# Patient Record
Sex: Male | Born: 1955 | Race: White | Hispanic: No | Marital: Married | Smoking: Current every day smoker
Health system: Southern US, Community
[De-identification: ages and names within clinical notes are randomized; demographics above are authoritative.]

## PROBLEM LIST (undated history)

## (undated) DIAGNOSIS — E785 Hyperlipidemia, unspecified: Secondary | ICD-10-CM

## (undated) DIAGNOSIS — I8392 Asymptomatic varicose veins of left lower extremity: Secondary | ICD-10-CM

## (undated) DIAGNOSIS — E7800 Pure hypercholesterolemia, unspecified: Secondary | ICD-10-CM

## (undated) DIAGNOSIS — Z86711 Personal history of pulmonary embolism: Secondary | ICD-10-CM

## (undated) DIAGNOSIS — I10 Essential (primary) hypertension: Secondary | ICD-10-CM

## (undated) HISTORY — PX: COLONOSCOPY: SHX174

## (undated) HISTORY — PX: VASECTOMY: SHX75

## (undated) HISTORY — PX: TUBAL LIGATION: SHX77

## (undated) HISTORY — DX: Asymptomatic varicose veins of left lower extremity: I83.92

## (undated) HISTORY — DX: Hyperlipidemia, unspecified: E78.5

---

## 2004-05-21 ENCOUNTER — Ambulatory Visit: Payer: Self-pay | Admitting: Gastroenterology

## 2004-06-17 ENCOUNTER — Ambulatory Visit: Payer: Self-pay | Admitting: Gastroenterology

## 2006-10-28 ENCOUNTER — Ambulatory Visit: Payer: Self-pay | Admitting: Gastroenterology

## 2011-08-17 HISTORY — PX: VASCULAR SURGERY: SHX849

## 2012-02-24 ENCOUNTER — Ambulatory Visit: Payer: Self-pay | Admitting: Gastroenterology

## 2012-02-24 LAB — HM COLONOSCOPY

## 2012-02-25 LAB — PATHOLOGY REPORT

## 2013-09-25 LAB — CBC AND DIFFERENTIAL
HCT: 47 % (ref 41–53)
Hemoglobin: 17 g/dL (ref 13.5–17.5)
PLATELETS: 166 10*3/uL (ref 150–399)
WBC: 4.4 10^3/mL

## 2013-09-25 LAB — TSH: TSH: 1.4 u[IU]/mL (ref <=5.90)

## 2013-11-26 LAB — LIPID PANEL
Cholesterol: 252 mg/dL — AB (ref 0–200)
HDL: 56 mg/dL (ref 35–70)
LDL Cholesterol: 165 mg/dL
TRIGLYCERIDES: 154 mg/dL (ref 40–160)

## 2013-11-26 LAB — BASIC METABOLIC PANEL
BUN: 14 mg/dL (ref 4–21)
CREATININE: 1.3 mg/dL (ref <=1.3)
Glucose: 106 mg/dL
POTASSIUM: 4.4 mmol/L (ref 3.4–5.3)
SODIUM: 140 mmol/L (ref 137–147)

## 2013-11-26 LAB — HEPATIC FUNCTION PANEL
ALT: 44 U/L — AB (ref 10–40)
AST: 31 U/L (ref 14–40)

## 2014-12-18 LAB — POCT INR: INR: 2 — AB (ref <=1.1)

## 2014-12-18 LAB — PROTIME-INR: PROTIME: 24.5 s — AB (ref 10.0–13.8)

## 2015-01-10 DIAGNOSIS — N419 Inflammatory disease of prostate, unspecified: Secondary | ICD-10-CM | POA: Insufficient documentation

## 2015-01-10 DIAGNOSIS — Z8614 Personal history of Methicillin resistant Staphylococcus aureus infection: Secondary | ICD-10-CM | POA: Insufficient documentation

## 2015-01-10 DIAGNOSIS — F17200 Nicotine dependence, unspecified, uncomplicated: Secondary | ICD-10-CM | POA: Insufficient documentation

## 2015-01-10 DIAGNOSIS — E785 Hyperlipidemia, unspecified: Secondary | ICD-10-CM | POA: Insufficient documentation

## 2015-01-10 DIAGNOSIS — K635 Polyp of colon: Secondary | ICD-10-CM | POA: Insufficient documentation

## 2015-01-10 DIAGNOSIS — D709 Neutropenia, unspecified: Secondary | ICD-10-CM | POA: Insufficient documentation

## 2015-01-10 DIAGNOSIS — G4700 Insomnia, unspecified: Secondary | ICD-10-CM | POA: Insufficient documentation

## 2015-01-10 DIAGNOSIS — I8392 Asymptomatic varicose veins of left lower extremity: Secondary | ICD-10-CM | POA: Insufficient documentation

## 2015-01-10 DIAGNOSIS — I82409 Acute embolism and thrombosis of unspecified deep veins of unspecified lower extremity: Secondary | ICD-10-CM | POA: Insufficient documentation

## 2015-01-10 DIAGNOSIS — E7800 Pure hypercholesterolemia, unspecified: Secondary | ICD-10-CM | POA: Insufficient documentation

## 2015-01-10 DIAGNOSIS — I10 Essential (primary) hypertension: Secondary | ICD-10-CM | POA: Insufficient documentation

## 2015-01-10 DIAGNOSIS — Z86711 Personal history of pulmonary embolism: Secondary | ICD-10-CM | POA: Insufficient documentation

## 2015-01-10 DIAGNOSIS — L0293 Carbuncle, unspecified: Secondary | ICD-10-CM | POA: Insufficient documentation

## 2015-01-10 DIAGNOSIS — I749 Embolism and thrombosis of unspecified artery: Secondary | ICD-10-CM | POA: Insufficient documentation

## 2015-01-10 DIAGNOSIS — F419 Anxiety disorder, unspecified: Secondary | ICD-10-CM | POA: Insufficient documentation

## 2015-01-10 DIAGNOSIS — I8390 Asymptomatic varicose veins of unspecified lower extremity: Secondary | ICD-10-CM | POA: Insufficient documentation

## 2015-01-10 HISTORY — DX: Personal history of Methicillin resistant Staphylococcus aureus infection: Z86.14

## 2015-01-15 ENCOUNTER — Other Ambulatory Visit: Payer: Self-pay | Admitting: Family Medicine

## 2015-01-16 ENCOUNTER — Telehealth: Payer: Self-pay

## 2015-01-16 LAB — PROTIME-INR
INR: 1.8 — ABNORMAL HIGH (ref 0.8–1.2)
INR: 1.8 — AB (ref 0.9–1.1)
PT: 18.7
Prothrombin Time: 18.7 s — ABNORMAL HIGH (ref 9.1–12.0)

## 2015-01-16 NOTE — Telephone Encounter (Signed)
Slightly low. Increase to 7.5 mg every day and recheck in 2 weeks.

## 2015-01-16 NOTE — Telephone Encounter (Signed)
PT/INR results  PT 18.7     INR 1.8

## 2015-01-16 NOTE — Telephone Encounter (Signed)
Tried to call pt. Left message to call back. 

## 2015-01-17 NOTE — Telephone Encounter (Signed)
Talked to pt regarding PT/INR results. PT 18.7 and INR 1.8.  Pt verbalized fully understanding for taking 7.5 mg daily and to come back for a PT/INR check  In 2 weeks.  Talked to front staff to make the appointment for Wednesday June 15th in the morning.

## 2015-01-29 ENCOUNTER — Ambulatory Visit (INDEPENDENT_AMBULATORY_CARE_PROVIDER_SITE_OTHER): Payer: BLUE CROSS/BLUE SHIELD

## 2015-01-29 DIAGNOSIS — I82409 Acute embolism and thrombosis of unspecified deep veins of unspecified lower extremity: Secondary | ICD-10-CM | POA: Diagnosis not present

## 2015-01-29 LAB — POCT INR
INR: 3.7
PT: 44.2

## 2015-01-29 NOTE — Patient Instructions (Signed)
PT/INR  INR 3.7 today.  Hold 1 pill today of 7.5 mg. 1 pill daily except 1/2 pill on Monday.  Come back in 2 weeks for PT/INR  Check Call if any questions or concerns.

## 2015-02-12 ENCOUNTER — Ambulatory Visit (INDEPENDENT_AMBULATORY_CARE_PROVIDER_SITE_OTHER): Payer: BLUE CROSS/BLUE SHIELD

## 2015-02-12 DIAGNOSIS — I82409 Acute embolism and thrombosis of unspecified deep veins of unspecified lower extremity: Secondary | ICD-10-CM | POA: Diagnosis not present

## 2015-02-12 LAB — POCT INR
INR: 2.1
PT: 25.7

## 2015-02-12 NOTE — Patient Instructions (Signed)
Continue same dose. Come back in 4 weeks. Call if any questions or concerns.

## 2015-03-12 ENCOUNTER — Ambulatory Visit (INDEPENDENT_AMBULATORY_CARE_PROVIDER_SITE_OTHER): Payer: BLUE CROSS/BLUE SHIELD

## 2015-03-12 DIAGNOSIS — I82409 Acute embolism and thrombosis of unspecified deep veins of unspecified lower extremity: Secondary | ICD-10-CM | POA: Diagnosis not present

## 2015-03-12 LAB — POCT INR
INR: 2.6
PT: 31

## 2015-03-12 NOTE — Patient Instructions (Signed)
PT/INR today INR 2.6  Continue same dose, Coumadin 7.5 mg Daily except 1/2 pill on Mondays. Come back in 4 weeks for an PT/INR. Call if any questions or concerns.

## 2015-04-14 ENCOUNTER — Other Ambulatory Visit: Payer: Self-pay | Admitting: Family Medicine

## 2015-04-14 DIAGNOSIS — I82409 Acute embolism and thrombosis of unspecified deep veins of unspecified lower extremity: Secondary | ICD-10-CM

## 2015-04-14 MED ORDER — WARFARIN SODIUM 7.5 MG PO TABS: 7.5000 mg | ORAL_TABLET | Freq: Every day | ORAL | Status: DC

## 2015-04-14 NOTE — Addendum Note (Signed)
Addended by: Kavin Leech E on: 04/14/2015 02:35 PM   Modules accepted: Orders

## 2015-04-14 NOTE — Telephone Encounter (Signed)
Refill needed warfarin (COUMADIN) 7.5 MG tablet  [facility] in [place].  Thanks [employer]

## 2015-04-15 ENCOUNTER — Other Ambulatory Visit: Payer: Self-pay | Admitting: Family Medicine

## 2015-04-15 DIAGNOSIS — I82409 Acute embolism and thrombosis of unspecified deep veins of unspecified lower extremity: Secondary | ICD-10-CM

## 2015-04-16 ENCOUNTER — Telehealth: Payer: Self-pay | Admitting: Family Medicine

## 2015-04-18 ENCOUNTER — Ambulatory Visit (INDEPENDENT_AMBULATORY_CARE_PROVIDER_SITE_OTHER): Payer: BLUE CROSS/BLUE SHIELD

## 2015-04-18 DIAGNOSIS — I82409 Acute embolism and thrombosis of unspecified deep veins of unspecified lower extremity: Secondary | ICD-10-CM | POA: Diagnosis not present

## 2015-04-18 LAB — POCT INR: INR: 2.7

## 2015-04-18 NOTE — Patient Instructions (Signed)
7.5 mg (1pill) Daily except  1/2 pill on Mondays. Come back in 4 weeks. Call if any questions or concerns.  

## 2015-05-16 ENCOUNTER — Ambulatory Visit (INDEPENDENT_AMBULATORY_CARE_PROVIDER_SITE_OTHER): Payer: BLUE CROSS/BLUE SHIELD

## 2015-05-16 ENCOUNTER — Telehealth: Payer: Self-pay | Admitting: Family Medicine

## 2015-05-16 DIAGNOSIS — I82409 Acute embolism and thrombosis of unspecified deep veins of unspecified lower extremity: Secondary | ICD-10-CM

## 2015-05-16 LAB — POCT INR
INR: 3
PT: 35.9

## 2015-05-16 NOTE — Telephone Encounter (Signed)
Patient need CPE scheduled. Thanks.

## 2015-05-16 NOTE — Telephone Encounter (Signed)
-----   Message from Girtha Hake, RN sent at 05/16/2015  8:26 AM EDT ----- 7.5 mg (1pill) Daily except  1/2 pill on Mondays. Come back in 4 weeks. Call if any questions or concerns.

## 2015-05-16 NOTE — Patient Instructions (Signed)
7.5 mg (1pill) Daily except  1/2 pill on Mondays. Come back in 4 weeks. Call if any questions or concerns.  

## 2015-05-19 NOTE — Telephone Encounter (Signed)
Appointment scheduled for 08/12/2015@3 :00/MW

## 2015-06-13 ENCOUNTER — Ambulatory Visit (INDEPENDENT_AMBULATORY_CARE_PROVIDER_SITE_OTHER): Payer: BLUE CROSS/BLUE SHIELD

## 2015-06-13 DIAGNOSIS — I82409 Acute embolism and thrombosis of unspecified deep veins of unspecified lower extremity: Secondary | ICD-10-CM

## 2015-06-13 LAB — POCT INR
INR: 2.6
PT: 31.5

## 2015-06-13 NOTE — Patient Instructions (Signed)
Anticoagulation Dose Instructions as of 06/13/2015      Tyler SmilesSun Mon Tue Wed Thu Fri Sat   New Dose 7.5 mg 3.75 mg 7.5 mg 7.5 mg 7.5 mg 7.5 mg 7.5 mg    Description        7.5 mg (1pill) Daily except  1/2 pill on Mondays. Come back in 4 weeks. Call if any questions or concerns.

## 2015-07-16 ENCOUNTER — Ambulatory Visit (INDEPENDENT_AMBULATORY_CARE_PROVIDER_SITE_OTHER): Payer: BLUE CROSS/BLUE SHIELD

## 2015-07-16 DIAGNOSIS — I82409 Acute embolism and thrombosis of unspecified deep veins of unspecified lower extremity: Secondary | ICD-10-CM | POA: Diagnosis not present

## 2015-07-16 LAB — POCT INR
INR: 2.1
PT: 25

## 2015-07-16 NOTE — Progress Notes (Signed)
Patient ID: Tyler Estes, male   DOB: 08-Mar-1956, 59 y.o.   MRN: [id number] Anticoagulation Dose Instructions as of 07/16/2015      Glynis SmilesSun Mon Tue Wed Thu Fri Sat   New Dose 7.5 mg 3.75 mg 7.5 mg 7.5 mg 7.5 mg 7.5 mg 7.5 mg    Description        7.5 mg (1pill) Daily except  1/2 pill on Mondays. Come back in 4 weeks. Call if any questions or concerns.

## 2015-07-16 NOTE — Patient Instructions (Signed)
7.5 mg (1pill) Daily except  1/2 pill on Mondays. Come back in 4 weeks. Call if any questions or concerns.

## 2015-08-12 ENCOUNTER — Encounter: Payer: BLUE CROSS/BLUE SHIELD | Admitting: Family Medicine

## 2015-08-13 ENCOUNTER — Ambulatory Visit (INDEPENDENT_AMBULATORY_CARE_PROVIDER_SITE_OTHER): Payer: BLUE CROSS/BLUE SHIELD

## 2015-08-13 DIAGNOSIS — I82409 Acute embolism and thrombosis of unspecified deep veins of unspecified lower extremity: Secondary | ICD-10-CM

## 2015-08-13 LAB — POCT INR
INR: 1.1
PT: 13.7

## 2015-08-13 NOTE — Patient Instructions (Signed)
Anticoagulation Dose Instructions as of 08/13/2015      Glynis SmilesSun Mon Tue Wed Thu Fri Sat   New Dose 7.5 mg 7.5 mg 7.5 mg 7.5 mg 7.5 mg 7.5 mg 7.5 mg    Description        7.5 mg daily Come back in 1 week Call if any questions or concerns.

## 2015-08-20 ENCOUNTER — Ambulatory Visit (INDEPENDENT_AMBULATORY_CARE_PROVIDER_SITE_OTHER): Payer: BLUE CROSS/BLUE SHIELD

## 2015-08-20 DIAGNOSIS — I82409 Acute embolism and thrombosis of unspecified deep veins of unspecified lower extremity: Secondary | ICD-10-CM | POA: Diagnosis not present

## 2015-08-20 LAB — POCT INR
INR: 2
PT: 24.2

## 2015-08-20 NOTE — Patient Instructions (Signed)
Anticoagulation Dose Instructions as of 08/20/2015      Glynis SmilesSun Mon Tue Wed Thu Fri Sat   New Dose 7.5 mg 7.5 mg 7.5 mg 7.5 mg 7.5 mg 7.5 mg 7.5 mg    Description        7.5 mg daily Come back in 2 week Call if any questions or concerns.

## 2015-09-03 ENCOUNTER — Ambulatory Visit (INDEPENDENT_AMBULATORY_CARE_PROVIDER_SITE_OTHER): Payer: BLUE CROSS/BLUE SHIELD

## 2015-09-03 DIAGNOSIS — I82409 Acute embolism and thrombosis of unspecified deep veins of unspecified lower extremity: Secondary | ICD-10-CM

## 2015-09-03 LAB — POCT INR
INR: 2.7
PT: 32.1

## 2015-09-03 NOTE — Patient Instructions (Signed)
Anticoagulation Dose Instructions as of 09/03/2015      Tyler Estes Tue Wed Thu Fri Sat   New Dose 7.5 mg 7.5 mg 7.5 mg 7.5 mg 7.5 mg 7.5 mg 7.5 mg    Description        7.5 mg daily Follow up in 4 week Call if any questions or concerns.

## 2015-09-30 ENCOUNTER — Encounter: Payer: Self-pay | Admitting: Family Medicine

## 2015-09-30 ENCOUNTER — Ambulatory Visit (INDEPENDENT_AMBULATORY_CARE_PROVIDER_SITE_OTHER): Payer: BLUE CROSS/BLUE SHIELD | Admitting: Family Medicine

## 2015-09-30 VITALS — BP 172/100 | HR 76 | Temp 97.9°F | Resp 16 | Ht 72.0 in | Wt 204.0 lb

## 2015-09-30 DIAGNOSIS — F17200 Nicotine dependence, unspecified, uncomplicated: Secondary | ICD-10-CM

## 2015-09-30 DIAGNOSIS — I82409 Acute embolism and thrombosis of unspecified deep veins of unspecified lower extremity: Secondary | ICD-10-CM

## 2015-09-30 DIAGNOSIS — E7800 Pure hypercholesterolemia, unspecified: Secondary | ICD-10-CM | POA: Diagnosis not present

## 2015-09-30 DIAGNOSIS — I10 Essential (primary) hypertension: Secondary | ICD-10-CM

## 2015-09-30 DIAGNOSIS — Z0000 Encounter for general adult medical examination without abnormal findings: Secondary | ICD-10-CM | POA: Diagnosis not present

## 2015-09-30 LAB — POCT INR
INR: 2.5
PT: 29.6

## 2015-09-30 MED ORDER — PRAVASTATIN SODIUM 40 MG PO TABS: 40.0000 mg | ORAL_TABLET | Freq: Every day | ORAL | Status: DC

## 2015-09-30 MED ORDER — LISINOPRIL-HYDROCHLOROTHIAZIDE 10-12.5 MG PO TABS: 1.0000 | ORAL_TABLET | Freq: Every day | ORAL | Status: DC

## 2015-09-30 NOTE — Patient Instructions (Signed)
Continue current dose. Recheck 4 weeks.

## 2015-09-30 NOTE — Progress Notes (Signed)
Patient: Tyler Estes, Male    DOB: 1956-03-11, 60 y.o.   MRN: [id number] Visit Date: 09/30/2015  Today's Provider: Lorie Phenix, MD   Chief Complaint  Patient presents with  . Annual Exam  . Anticoagulation   Subjective:    Annual physical exam Tyler Estes is a 60 y.o. male who presents today for health maintenance and complete physical. He feels well. He reports exercising daily; active while working. He reports he is sleeping fairly well.  Last CPE- 10/22/2011 Last EKG- 2013 Last colon- 02/24/2012- Dr. Bluford Kaufmann. 1 small polyp (tubular adenoma). Repeat 5 years.  ----------------------------------------------------------------- Anticoagulation: Patient here for anticoagulation monitoring. Indication: DVT and PE Bleeding Signs/Symptoms:  None Thromboembolic Signs/Symptoms:  None  Missed Coumadin Doses:  None Medication Changes:  no Dietary Changes:  no Bacterial/Viral Infection:  no  Other Concerns:  no  Hypertension: Patient here for follow-up of elevated blood pressure. He is exercising and is adherent to low salt diet.  Blood pressure is not being checked well controlled at home. Cardiac symptoms none. Patient denies chest pain, dyspnea, lower extremity edema and palpitations.  Cardiovascular risk factors: dyslipidemia, hypertension, male gender and smoking/ tobacco exposure. Use of agents associated with hypertension: none. History of target organ damage: none.  Review of Systems  Constitutional: Negative.   HENT: Negative.   Eyes: Negative.   Respiratory: Negative.   Cardiovascular: Negative.   Gastrointestinal: Negative.   Endocrine: Negative.   Genitourinary: Negative.   Musculoskeletal: Negative.   Skin: Negative.   Allergic/Immunologic: Negative.   Neurological: Negative.   Hematological: Negative.   Psychiatric/Behavioral: Negative.     Social History      He  reports that he has been smoking Cigarettes.  He has been smoking about 0.50 packs per  day. He has never used smokeless tobacco. He reports that he drinks about 8.4 oz of alcohol per week. He reports that he does not use illicit drugs.       Social History   Social History  . Marital Status: Married    Spouse Name: Hendrick Pavich  . Number of Children: 2  . Years of Education: HS   Occupational History  . 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 Smoking status: Current Every Day Smoker -- 0.50 packs/day    Types: Cigarettes  . Smokeless tobacco: Never Used  . Alcohol Use: 8.4 oz/week    14 Cans of beer per week     Comment: 2 beers a day  . Drug Use: No     Comment: Quit in 1997  . Sexual Activity: Not Asked   Other Topics Concern  . None   Social History Narrative    No past medical history on file.   Patient Active Problem List   Diagnosis Date Noted  . Anxiety 01/10/2015  . Carbuncle of skin and subcutaneous tissue 01/10/2015  . Benign colon polyp 01/10/2015  . Deep vein thrombosis (HCC) 01/10/2015  . Embolism (HCC) 01/10/2015  . Hypercholesteremia 01/10/2015  . BP (high blood pressure) 01/10/2015  . Cannot sleep 01/10/2015  . Infection with methicillin-resistant Staphylococcus aureus 01/10/2015  . Neutropenia (HCC) 01/10/2015  . PE (pulmonary embolism) 01/10/2015  . Prostatitis 01/10/2015  . 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 Compulsive tobacco user syndrome 01/10/2015    Past Surgical History  Procedure Laterality Date  . Vascular surgery  2013    Leg  . Vasectomy      Family  History        Family Status  Relation Status Death Age  . Mother Alive     Back Operations  . Father Deceased 48    Myocardial Infarction; Mesothelioma  . Sister Alive     Alzheimers        His family history includes Dementia in his sister; Heart disease in his father; Hyperlipidemia in his mother.    Allergies  Allergen Reactions  . Aspirin     Previous Medications   CHLORTHALIDONE (HYGROTON) 25 MG TABLET    Take by mouth. Reported on  09/30/2015   PRAVASTATIN (PRAVACHOL) 40 MG TABLET    Take by mouth. Reported on 09/30/2015   WARFARIN (COUMADIN) 7.5 MG TABLET    TAKE ONE TABLET BY MOUTH ONCE DAILY AS DIRECTED BY PHYSICIAN    Patient Care Team: Lorie Phenix, MD as PCP - General (Family Medicine)     Objective:   Vitals: BP 172/100 mmHg  Pulse 76  Temp(Src) 97.9 F (36.6 C) (Oral)  Resp 16  Ht 6' (1.829 m)  Wt 204 lb (92.534 kg)  BMI 27.66 kg/m2   Physical Exam  Constitutional: He is oriented to person, place, and time. He appears well-developed and well-nourished.  HENT:  Head: Normocephalic and atraumatic.  Right Ear: External ear normal.  Left Ear: External ear normal.  Nose: Nose normal.  Mouth/Throat: Oropharynx is clear and moist.  Eyes: Conjunctivae are normal. Right eye exhibits no discharge. Left eye exhibits no discharge.  Neck: Normal range of motion. Neck supple. No thyromegaly present.  Cardiovascular: Normal rate, regular rhythm and normal heart sounds.   Pulmonary/Chest: Effort normal and breath sounds normal. No respiratory distress. He has no wheezes.  Abdominal: Soft. Bowel sounds are normal.  Musculoskeletal: Normal range of motion. He exhibits no edema.  Neurological: He is alert and oriented to person, place, and time.  Skin: Skin is warm and dry.  Psychiatric: He has a normal mood and affect. His behavior is normal.     Depression Screen PHQ 2/9 Scores 09/30/2015  PHQ - 2 Score 0      Assessment & Plan:     Routine Health Maintenance and Physical Exam  Exercise Activities and Dietary recommendations Goals    None       There is no immunization history on file for this patient.  Health Maintenance  Topic Date Due  . Hepatitis C Screening  10-Apr-1956  . HIV Screening  07/06/1970  . TETANUS/TDAP  07/06/1974  . ZOSTAVAX  07/07/2015  . INFLUENZA VACCINE  11/14/2015 (Originally 03/17/2015)  . COLONOSCOPY  02/23/2022      Discussed health benefits of physical  activity, and encouraged him to engage in regular exercise appropriate for his age and condition.   1. Annual physical exam Stressed importance of taking better care of himself. Quitting smoking and decreasing drinking. Increasing exercise.  Continue current medication.    2. DVT (deep venous thrombosis), unspecified laterality Stable. Continue current medication and recheck at follow up in 4 weeks.   - POCT INR  3. Essential hypertension Not controlled.  Will start Lisinopril- HCTZ and recheck in 4 weeks.  Check labs today and again in 4 weeks.  - CBC with Differential/Platelet - Comprehensive metabolic panel - lisinopril-hydrochlorothiazide (PRINZIDE,ZESTORETIC) 10-12.5 MG tablet; Take 1 tablet by mouth daily.  Dispense: 30 tablet; Refill: 3  4. Hypercholesteremia Restart medication. Check labs in 4 weeks.  - pravastatin (PRAVACHOL) 40 MG tablet; Take 1 tablet (40 mg total)  by mouth daily. Reported on 09/30/2015  Dispense: 30 tablet; Refill: 6  5. Compulsive tobacco user syndrome Stressed importance of quitting smoking.   Tried to explain very clearly and forcefully that his current lifestyle and not taking his medication is as significant risk to his health as stopping his coumadin, if not more so.    Patient was seen and examined by Leo Grosser, MD, and note scribed by Allene Dillon, CMA. I have reviewed the document for accuracy and completeness and I agree with above. Leo Grosser, MD   Lorie Phenix, MD    --------------------------------------------------------------------

## 2015-10-01 ENCOUNTER — Telehealth: Payer: Self-pay

## 2015-10-01 ENCOUNTER — Ambulatory Visit: Payer: BLUE CROSS/BLUE SHIELD

## 2015-10-01 LAB — COMPREHENSIVE METABOLIC PANEL
A/G RATIO: 2.1 (ref 1.1–2.5)
ALK PHOS: 73 IU/L (ref 39–117)
ALT: 38 IU/L (ref 0–44)
AST: 24 IU/L (ref 0–40)
Albumin: 4.4 g/dL (ref 3.6–4.8)
BILIRUBIN TOTAL: 0.3 mg/dL (ref 0.0–1.2)
BUN / CREAT RATIO: 16 (ref 10–22)
BUN: 15 mg/dL (ref 8–27)
CHLORIDE: 105 mmol/L (ref 96–106)
CO2: 21 mmol/L (ref 18–29)
Calcium: 9.3 mg/dL (ref 8.6–10.2)
Creatinine, Ser: 0.93 mg/dL (ref 0.76–1.27)
GFR calc non Af Amer: 89 mL/min/{1.73_m2} (ref >59)
GFR, EST AFRICAN AMERICAN: 103 mL/min/{1.73_m2} (ref >59)
Globulin, Total: 2.1 g/dL (ref 1.5–4.5)
Glucose: 110 mg/dL — ABNORMAL HIGH (ref 65–99)
POTASSIUM: 4.3 mmol/L (ref 3.5–5.2)
Sodium: 144 mmol/L (ref 134–144)
Total Protein: 6.5 g/dL (ref 6.0–8.5)

## 2015-10-01 LAB — CBC WITH DIFFERENTIAL/PLATELET
BASOS: 0 %
Basophils Absolute: 0 10*3/uL (ref 0.0–0.2)
EOS (ABSOLUTE): 0.1 10*3/uL (ref 0.0–0.4)
Eos: 1 %
HEMOGLOBIN: 16.5 g/dL (ref 12.6–17.7)
Hematocrit: 47 % (ref 37.5–51.0)
IMMATURE GRANS (ABS): 0 10*3/uL (ref 0.0–0.1)
Immature Granulocytes: 0 %
LYMPHS: 39 %
Lymphocytes Absolute: 1.7 10*3/uL (ref 0.7–3.1)
MCH: 33.8 pg — AB (ref 26.6–33.0)
MCHC: 35.1 g/dL (ref 31.5–35.7)
MCV: 96 fL (ref 79–97)
Monocytes Absolute: 0.4 10*3/uL (ref 0.1–0.9)
Monocytes: 10 %
NEUTROS ABS: 2.1 10*3/uL (ref 1.4–7.0)
Neutrophils: 50 %
PLATELETS: 202 10*3/uL (ref 150–379)
RBC: 4.88 x10E6/uL (ref 4.14–5.80)
RDW: 13.5 % (ref 12.3–15.4)
WBC: 4.2 10*3/uL (ref 3.4–10.8)

## 2015-10-01 NOTE — Telephone Encounter (Signed)
Patient advised as below. sd 

## 2015-10-01 NOTE — Telephone Encounter (Signed)
-----   Message from Lorie Phenix, MD sent at 10/01/2015  8:50 AM EST ----- Labs stable.  Please notify patient. Thanks.

## 2015-10-28 ENCOUNTER — Ambulatory Visit (INDEPENDENT_AMBULATORY_CARE_PROVIDER_SITE_OTHER): Payer: BLUE CROSS/BLUE SHIELD | Admitting: Family Medicine

## 2015-10-28 ENCOUNTER — Encounter: Payer: Self-pay | Admitting: Family Medicine

## 2015-10-28 VITALS — BP 168/98 | HR 84 | Temp 98.8°F | Resp 16 | Wt 205.0 lb

## 2015-10-28 DIAGNOSIS — I2699 Other pulmonary embolism without acute cor pulmonale: Secondary | ICD-10-CM

## 2015-10-28 DIAGNOSIS — I82409 Acute embolism and thrombosis of unspecified deep veins of unspecified lower extremity: Secondary | ICD-10-CM

## 2015-10-28 DIAGNOSIS — E7800 Pure hypercholesterolemia, unspecified: Secondary | ICD-10-CM

## 2015-10-28 DIAGNOSIS — I10 Essential (primary) hypertension: Secondary | ICD-10-CM | POA: Diagnosis not present

## 2015-10-28 LAB — POCT INR
INR: 2.8
PT: 33.4

## 2015-10-28 MED ORDER — LISINOPRIL-HYDROCHLOROTHIAZIDE 20-25 MG PO TABS: 1.0000 | ORAL_TABLET | Freq: Every day | ORAL | Status: DC

## 2015-10-28 NOTE — Patient Instructions (Signed)
Continue current medications; recheck in four weeks.

## 2015-10-28 NOTE — Progress Notes (Signed)
Patient ID: Tyler Estes, male   DOB: [birth year], 60 y.o.   MRN: [id number]         Patient: Tyler Estes Male    DOB: [birth year]   60 y.o.   MRN: [id number] Visit Date: 10/28/2015  Today's Provider: Lorie Phenix, MD   Chief Complaint  Patient presents with  . Hypertension  . Hyperlipidemia  . DVT   Subjective:    Hypertension This is a chronic (Pt's Blood pressure was elevated last visit 09/2015 and added Lisinopril/HCTZ 10/12.5mg  a day) problem. The problem is unchanged. The problem is uncontrolled (He does not check is BP outside of here.  But reports not having any cardiac symptoms. ). Pertinent negatives include no anxiety, blurred vision, chest pain, headaches, malaise/fatigue, orthopnea, palpitations, peripheral edema, PND, shortness of breath or sweats. Risk factors for coronary artery disease include dyslipidemia, male gender and smoking/tobacco exposure. Past treatments include diuretics and ACE inhibitors. The current treatment provides no improvement. There are no compliance problems.   Hyperlipidemia This is a chronic (Pt just restarted Pravastatin 09/2015) problem. The problem is controlled. Pertinent negatives include no chest pain, focal sensory loss, focal weakness, leg pain, myalgias or shortness of breath. Current antihyperlipidemic treatment includes statins. There are no compliance problems.        Allergies  Allergen Reactions  . Aspirin    Previous Medications   PRAVASTATIN (PRAVACHOL) 40 MG TABLET    Take 1 tablet (40 mg total) by mouth daily. Reported on 09/30/2015   WARFARIN (COUMADIN) 7.5 MG TABLET    TAKE ONE TABLET BY MOUTH ONCE DAILY AS DIRECTED BY PHYSICIAN    Review of Systems  Constitutional: Negative.  Negative for malaise/fatigue.  Eyes: Negative for blurred vision.  Respiratory: Negative.  Negative for shortness of breath.   Cardiovascular: Negative.  Negative for chest pain, palpitations, orthopnea and PND.  Gastrointestinal: Negative.     Musculoskeletal: Negative for myalgias.  Neurological: Negative for dizziness, tremors, focal weakness, weakness, light-headedness and headaches.  Hematological: Negative for adenopathy. Does not bruise/bleed easily.    Social History  Substance Use Topics  . Smoking status: Current Every Day Smoker -- 0.50 packs/day    Types: Cigarettes  . Smokeless tobacco: Never Used  . Alcohol Use: 16.8 oz/week    28 Cans of beer per week     Comment: 4 beers a day   Objective:   BP 168/98 mmHg  Pulse 84  Temp(Src) 98.8 F (37.1 C) (Oral)  Resp 16  Wt 205 lb (92.987 kg)  Physical Exam  Constitutional: He is oriented to person, place, and time. He appears well-developed and well-nourished.  Cardiovascular: Normal rate, regular rhythm and normal heart sounds.   Pulmonary/Chest: Effort normal and breath sounds normal.  Neurological: He is alert and oriented to person, place, and time. He has normal reflexes.  Psychiatric: He has a normal mood and affect. His behavior is normal. Judgment and thought content normal.        Assessment & Plan:     1. DVT (deep venous thrombosis), unspecified laterality PT/INR stable continue current dose and recheck in four weeks.  - POCT INR  2. Essential hypertension BP still elevated; has not improved any.  Will D/C Chlorthalidone and double Lisinopril/HCTZ to 20/25mg  daily.  Recheck in four weeks.  - Comprehensive metabolic panel - Lipid panel - lisinopril-hydrochlorothiazide (PRINZIDE,ZESTORETIC) 20-25 MG tablet; Take 1 tablet by mouth daily.  Dispense: 90 tablet; Refill: 1  3. PE (pulmonary embolism) PT/INR stable continue current dose  and recheck in four weeks  4. Hypercholesteremia Pt is tolerating Pravastatin without side effects.  Will recheck labs.  - Comprehensive metabolic panel - Lipid panel     Patient was seen and examined by Leo GrosserNancy J. Arther Heisler, MD, and note scribed by Kavin LeechLaura Walsh, CMA.  I have reviewed the document for accuracy and  completeness and I agree with above. - Leo GrosserNancy J. Terrilee Dudzik, MD   Lorie PhenixNancy Corley Kohls, MD  [facility]

## 2015-11-04 LAB — LIPID PANEL
CHOL/HDL RATIO: 4.1 ratio (ref 0.0–5.0)
Cholesterol, Total: 230 mg/dL — ABNORMAL HIGH (ref 100–199)
HDL: 56 mg/dL (ref >39)
LDL CALC: 134 mg/dL — AB (ref 0–99)
TRIGLYCERIDES: 198 mg/dL — AB (ref 0–149)
VLDL CHOLESTEROL CAL: 40 mg/dL (ref 5–40)

## 2015-11-04 LAB — COMPREHENSIVE METABOLIC PANEL
ALBUMIN: 4.5 g/dL (ref 3.6–4.8)
ALT: 39 IU/L (ref 0–44)
AST: 26 IU/L (ref 0–40)
Albumin/Globulin Ratio: 2 (ref 1.2–2.2)
Alkaline Phosphatase: 66 IU/L (ref 39–117)
BUN/Creatinine Ratio: 14 (ref 10–22)
BUN: 15 mg/dL (ref 8–27)
Bilirubin Total: 0.4 mg/dL (ref 0.0–1.2)
CALCIUM: 9.2 mg/dL (ref 8.6–10.2)
CO2: 22 mmol/L (ref 18–29)
CREATININE: 1.1 mg/dL (ref 0.76–1.27)
Chloride: 102 mmol/L (ref 96–106)
GFR calc Af Amer: 84 mL/min/{1.73_m2} (ref >59)
GFR, EST NON AFRICAN AMERICAN: 73 mL/min/{1.73_m2} (ref >59)
GLOBULIN, TOTAL: 2.2 g/dL (ref 1.5–4.5)
Glucose: 102 mg/dL — ABNORMAL HIGH (ref 65–99)
Potassium: 4.5 mmol/L (ref 3.5–5.2)
SODIUM: 141 mmol/L (ref 134–144)
Total Protein: 6.7 g/dL (ref 6.0–8.5)

## 2015-11-05 ENCOUNTER — Telehealth: Payer: Self-pay

## 2015-11-05 NOTE — Telephone Encounter (Signed)
Pt advised.   Thanks,   -Irma Roulhac  

## 2015-11-05 NOTE — Telephone Encounter (Signed)
-----   Message from Lorie PhenixNancy Maloney, MD sent at 11/05/2015  6:34 AM EDT ----- Labs stable. Please notify patient. Thanks.

## 2015-12-03 ENCOUNTER — Encounter: Payer: Self-pay | Admitting: Family Medicine

## 2015-12-03 ENCOUNTER — Ambulatory Visit (INDEPENDENT_AMBULATORY_CARE_PROVIDER_SITE_OTHER): Payer: BLUE CROSS/BLUE SHIELD | Admitting: Family Medicine

## 2015-12-03 VITALS — BP 138/88 | HR 72 | Temp 98.5°F | Resp 16 | Wt 204.0 lb

## 2015-12-03 DIAGNOSIS — I2699 Other pulmonary embolism without acute cor pulmonale: Secondary | ICD-10-CM | POA: Diagnosis not present

## 2015-12-03 DIAGNOSIS — I82409 Acute embolism and thrombosis of unspecified deep veins of unspecified lower extremity: Secondary | ICD-10-CM | POA: Diagnosis not present

## 2015-12-03 DIAGNOSIS — I10 Essential (primary) hypertension: Secondary | ICD-10-CM

## 2015-12-03 LAB — POCT INR
INR: 2.3
PT: 27.5

## 2015-12-03 NOTE — Progress Notes (Signed)
Patient ID: Tyler Estes Mom, male   DOB: 12-Aug-1956, 60 y.o.   MRN: [id number]        Patient: Tyler Estes Weir Male    DOB: 12-Aug-1956   60 y.o.   MRN: [id number] Visit Date: 12/03/2015  Today's Provider: Lorie PhenixNancy Jarian Longoria, MD   Chief Complaint  Patient presents with  . Hypertension  . DVT   Subjective:    Hypertension This is a chronic problem. The problem has been gradually improving (Increased Lisinopril/HCTZ to 20/25mg  a day.) since onset. The problem is controlled. Pertinent negatives include no anxiety, blurred vision, chest pain, headaches, malaise/fatigue, peripheral edema or shortness of breath. Risk factors for coronary artery disease include dyslipidemia, male gender, smoking/tobacco exposure and family history. Past treatments include ACE inhibitors and diuretics. The current treatment provides moderate improvement. There are no compliance problems.    BP Readings from Last 3 Encounters:  12/03/15 138/88  10/28/15 168/98  09/30/15 172/100       Allergies  Allergen Reactions  . Aspirin    Previous Medications   LISINOPRIL-HYDROCHLOROTHIAZIDE (PRINZIDE,ZESTORETIC) 20-25 MG TABLET    Take 1 tablet by mouth daily.   PRAVASTATIN (PRAVACHOL) 40 MG TABLET    Take 1 tablet (40 mg total) by mouth daily. Reported on 09/30/2015   WARFARIN (COUMADIN) 7.5 MG TABLET    TAKE ONE TABLET BY MOUTH ONCE DAILY AS DIRECTED BY PHYSICIAN    Review of Systems  Constitutional: Negative.  Negative for malaise/fatigue.  Eyes: Negative for blurred vision.  Respiratory: Negative.  Negative for shortness of breath.   Cardiovascular: Negative.  Negative for chest pain.  Gastrointestinal: Negative.   Musculoskeletal: Negative.   Neurological: Negative for dizziness, light-headedness and headaches.    Social History  Substance Use Topics  . Smoking status: Current Every Day Smoker -- 0.50 packs/day    Types: Cigarettes  . Smokeless tobacco: Never Used  . Alcohol Use: 16.8 oz/week    28 Cans  of beer per week     Comment: 4 beers a day   Objective:   BP 138/88 mmHg  Pulse 72  Temp(Src) 98.5 F (36.9 C) (Oral)  Resp 16  Wt 204 lb (92.534 kg)   Physical Exam  Constitutional: He is oriented to person, place, and time. He appears well-developed and well-nourished.  Neurological: He is alert and oriented to person, place, and time.  Skin: Skin is warm and dry.  Psychiatric: He has a normal mood and affect. His behavior is normal. Judgment and thought content normal.    Results for orders placed or performed in visit on 12/03/15  POCT INR  Result Value Ref Range   INR 2.3    PT 27.5      Anticoagulation Dose Instructions as of 12/03/2015      Glynis SmilesSun Mon Tue Wed Thu Fri Sat   New Dose 7.5 mg 3.75 mg 7.5 mg 7.5 mg 7.5 mg 7.5 mg 7.5 mg    Description        Continue current medications; recheck in four weeks.           Assessment & Plan:     1. Essential hypertension Blood pressure improved.  Continue current medications. Still recommended smoking cessation.     2. DVT (deep venous thrombosis), unspecified laterality Stable; continue current dose.  Recheck in 4 weeks. [facility].    - POCT INR  Patient was seen and examined by Leo GrosserNancy J. Taeya Theall, MD, and note scribed by Vernona RiegerLaura  Clent Ridges, CMA.  I have reviewed the document for accuracy and completeness and I agree with above. - Leo Grosser, MD     Lorie Phenix, MD  [facility]

## 2015-12-03 NOTE — Patient Instructions (Signed)
Continue current medication; recheck in four weeks.

## 2015-12-31 ENCOUNTER — Encounter: Payer: Self-pay | Admitting: Family Medicine

## 2015-12-31 ENCOUNTER — Ambulatory Visit (INDEPENDENT_AMBULATORY_CARE_PROVIDER_SITE_OTHER): Payer: BLUE CROSS/BLUE SHIELD | Admitting: Family Medicine

## 2015-12-31 VITALS — BP 128/78 | HR 80 | Temp 97.6°F | Resp 16 | Wt 205.0 lb

## 2015-12-31 DIAGNOSIS — I82409 Acute embolism and thrombosis of unspecified deep veins of unspecified lower extremity: Secondary | ICD-10-CM | POA: Diagnosis not present

## 2015-12-31 DIAGNOSIS — I825Z9 Chronic embolism and thrombosis of unspecified deep veins of unspecified distal lower extremity: Secondary | ICD-10-CM | POA: Diagnosis not present

## 2015-12-31 DIAGNOSIS — I10 Essential (primary) hypertension: Secondary | ICD-10-CM | POA: Diagnosis not present

## 2015-12-31 DIAGNOSIS — F17200 Nicotine dependence, unspecified, uncomplicated: Secondary | ICD-10-CM

## 2015-12-31 LAB — POCT INR
INR: 2.3
PT: 27.2

## 2015-12-31 NOTE — Progress Notes (Signed)
Subjective:    Patient ID: Tyler MonacoGerald Pagnotta, male    DOB: 1956/01/28, 60 y.o.   MRN: [id number]  Hypertension This is a chronic problem. The problem is controlled. Pertinent negatives include no anxiety, blurred vision, chest pain, headaches, malaise/fatigue, neck pain, orthopnea, palpitations, peripheral edema or shortness of breath. Risk factors for coronary artery disease include dyslipidemia, male gender, smoking/tobacco exposure and family history. Treatments tried: currently taking Lisinopril-HCTZ 20-25 mg. The current treatment provides moderate improvement. There are no compliance problems.    Anticoagulation: Patient here for anticoagulation monitoring. Indication: DVT Bleeding Signs/Symptoms:  None Thromboembolic Signs/Symptoms:  None  Missed Coumadin Doses:  None Medication Changes:  no Dietary Changes:  no Bacterial/Viral Infection:  no  Other Concerns:  No  Still smoking.     Review of Systems  Constitutional: Negative for malaise/fatigue.  Eyes: Negative for blurred vision.  Respiratory: Negative for shortness of breath.   Cardiovascular: Negative for chest pain, palpitations and orthopnea.  Musculoskeletal: Negative for neck pain.  Neurological: Negative for headaches.   BP 128/78 mmHg  Pulse 80  Temp(Src) 97.6 F (36.4 C) (Oral)  Resp 16  Wt 205 lb (92.987 kg)   Patient Active Problem List   Diagnosis Date Noted  . Anxiety 01/10/2015  . Carbuncle of skin and subcutaneous tissue 01/10/2015  . Benign colon polyp 01/10/2015  . Deep vein thrombosis (HCC) 01/10/2015  . Hypercholesteremia 01/10/2015  . BP (high blood pressure) 01/10/2015  . Cannot sleep 01/10/2015  . Infection with methicillin-resistant Staphylococcus aureus 01/10/2015  . Neutropenia (HCC) 01/10/2015  . PE (pulmonary embolism) 01/10/2015  . Prostatitis 01/10/2015  . 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 Compulsive tobacco user syndrome 01/10/2015   No past medical history on file. Current  Outpatient Prescriptions on File Prior to Visit  Medication Sig  . lisinopril-hydrochlorothiazide (PRINZIDE,ZESTORETIC) 20-25 MG tablet Take 1 tablet by mouth daily.  . pravastatin (PRAVACHOL) 40 MG tablet Take 1 tablet (40 mg total) by mouth daily. Reported on 09/30/2015  . warfarin (COUMADIN) 7.5 MG tablet TAKE ONE TABLET BY MOUTH ONCE DAILY AS DIRECTED BY PHYSICIAN   No current facility-administered medications on file prior to visit.   Allergies  Allergen Reactions  . Aspirin    Past Surgical History  Procedure Laterality Date  . Vascular surgery  2013    Leg  . Vasectomy     Social History   Social History  . Marital Status: Married    Spouse Name: Roylene ReasonBecky Thelen  . Number of Children: 2  . Years of Education: HS   Occupational History  . 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 Smoking status: Current Every Day Smoker -- 0.50 packs/day    Types: Cigarettes  . Smokeless tobacco: Never Used  . Alcohol Use: 16.8 oz/week    28 Cans of beer per week     Comment: 4 beers a day  . Drug Use: No     Comment: Quit in 1997  . Sexual Activity: Not on file   Other Topics Concern  . Not on file   Social History Narrative   Family History  Problem Relation Age of Onset  . Hyperlipidemia Mother   . Heart disease Father   . Dementia Sister         Objective:   Physical Exam  Constitutional: He is oriented to person, place, and time. He appears well-developed and well-nourished.  Cardiovascular: Normal rate and regular rhythm.   Pulmonary/Chest:  Effort normal and breath sounds normal.  Neurological: He is alert and oriented to person, place, and time.  Psychiatric: He has a normal mood and affect. His behavior is normal.  BP 128/78 mmHg  Pulse 80  Temp(Src) 97.6 F (36.4 C) (Oral)  Resp 16  Wt 205 lb (92.987 kg)     Assessment & Plan:  1. DVT (deep venous thrombosis), unspecified laterality Stable today. Keep the dose the same, and recheck 4  weeks. - POCT INR Results for orders placed or performed in visit on 12/31/15  POCT INR  Result Value Ref Range   INR 2.3    PT 27.2     Anticoagulation Dose Instructions as of 12/31/2015      Glynis Smiles Tue Wed Thu Fri Sat   New Dose 7.5 mg 3.75 mg 7.5 mg 7.5 mg 7.5 mg 7.5 mg 7.5 mg    Description        Continue current medications; recheck in four weeks.       2. Essential hypertension Stable. Continue current medications and plan of care.  3. 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 See above.  4. Compulsive tobacco user syndrome Unchanged. Encouraged pt to quit smoking.   Patient seen and examined by Leo Grosser, MD, and note scribed by Allene Dillon, CMA.  I have reviewed the document for accuracy and completeness and I agree with above. Leo Grosser, MD   Lorie Phenix, MD

## 2015-12-31 NOTE — Patient Instructions (Signed)
Anticoagulation Dose Instructions as of 12/31/2015      Glynis SmilesSun Mon Tue Wed Thu Fri Sat   New Dose 7.5 mg 3.75 mg 7.5 mg 7.5 mg 7.5 mg 7.5 mg 7.5 mg    Description        Continue current medications; recheck in four weeks.

## 2016-01-28 ENCOUNTER — Encounter: Payer: Self-pay | Admitting: Family Medicine

## 2016-01-28 ENCOUNTER — Ambulatory Visit (INDEPENDENT_AMBULATORY_CARE_PROVIDER_SITE_OTHER): Payer: BLUE CROSS/BLUE SHIELD

## 2016-01-28 DIAGNOSIS — I82409 Acute embolism and thrombosis of unspecified deep veins of unspecified lower extremity: Secondary | ICD-10-CM | POA: Diagnosis not present

## 2016-01-28 LAB — POCT INR
INR: 2.6
PT: 31

## 2016-01-28 NOTE — Patient Instructions (Signed)
INR as of 01/28/2016 and Previous Dosing Information    INR Dt INR Goal Tyler ConesWkly Tot Sun Mon Tue Wed Thu Fri Sat   01/28/2016 2.6 - 48.75 mg 7.5 mg 3.75 mg 7.5 mg 7.5 mg 7.5 mg 7.5 mg 7.5 mg    Previous description        Continue current medications; recheck in four weeks.     Anticoagulation Dose Instructions as of 01/28/2016      Total Sun Mon Tue Wed Thu Fri Sat   New Dose 48.75 mg 7.5 mg 3.75 mg 7.5 mg 7.5 mg 7.5 mg 7.5 mg 7.5 mg     (7.5 mg x 1)  (7.5 mg x 0.5)  (7.5 mg x 1)  (7.5 mg x 1)  (7.5 mg x 1)  (7.5 mg x 1)  (7.5 mg x 1)                         Description        Continue current medications; recheck in four weeks.

## 2016-01-29 NOTE — Progress Notes (Signed)
PT only

## 2016-02-28 ENCOUNTER — Encounter: Payer: Self-pay | Admitting: Internal Medicine

## 2016-03-02 ENCOUNTER — Encounter: Payer: Self-pay | Admitting: Internal Medicine

## 2016-03-02 ENCOUNTER — Ambulatory Visit (INDEPENDENT_AMBULATORY_CARE_PROVIDER_SITE_OTHER): Payer: BLUE CROSS/BLUE SHIELD | Admitting: Internal Medicine

## 2016-03-02 VITALS — BP 138/86 | HR 74 | Resp 16 | Ht 72.0 in | Wt 204.0 lb

## 2016-03-02 DIAGNOSIS — E7800 Pure hypercholesterolemia, unspecified: Secondary | ICD-10-CM

## 2016-03-02 DIAGNOSIS — I10 Essential (primary) hypertension: Secondary | ICD-10-CM | POA: Diagnosis not present

## 2016-03-02 DIAGNOSIS — F17200 Nicotine dependence, unspecified, uncomplicated: Secondary | ICD-10-CM

## 2016-03-02 DIAGNOSIS — Z23 Encounter for immunization: Secondary | ICD-10-CM

## 2016-03-02 DIAGNOSIS — Z720 Tobacco use: Secondary | ICD-10-CM | POA: Diagnosis not present

## 2016-03-02 DIAGNOSIS — I2699 Other pulmonary embolism without acute cor pulmonale: Secondary | ICD-10-CM | POA: Diagnosis not present

## 2016-03-02 DIAGNOSIS — I825Z9 Chronic embolism and thrombosis of unspecified deep veins of unspecified distal lower extremity: Secondary | ICD-10-CM

## 2016-03-02 DIAGNOSIS — Z86718 Personal history of other venous thrombosis and embolism: Secondary | ICD-10-CM | POA: Insufficient documentation

## 2016-03-02 MED ORDER — PRAVASTATIN SODIUM 40 MG PO TABS
40.0000 mg | ORAL_TABLET | Freq: Every day | ORAL | Status: DC
Start: 2016-03-02 — End: 2016-03-05

## 2016-03-02 MED ORDER — LISINOPRIL-HYDROCHLOROTHIAZIDE 20-25 MG PO TABS: 1.0000 | ORAL_TABLET | Freq: Every day | ORAL | Status: DC

## 2016-03-02 MED ORDER — RIVAROXABAN 20 MG PO TABS: 20.0000 mg | ORAL_TABLET | Freq: Every day | ORAL | Status: DC

## 2016-03-02 NOTE — Progress Notes (Signed)
Date:  03/02/2016   Name:  Tyler Estes   DOB:  [birth year]   MRN:  [id number]  New patient transferring from Dr. Elease Hashimoto who is leaving [facility].  Chief Complaint: Establish Care; Hypertension; Hyperlipidemia; and DVT Hypertension This is a chronic problem. The current episode started more than 1 month ago. The problem has been gradually improving since onset. The problem is controlled. Pertinent negatives include no chest pain, headaches, palpitations or shortness of breath. Risk factors for coronary artery disease include smoking/tobacco exposure and dyslipidemia. Past treatments include ACE inhibitors and diuretics.  Hyperlipidemia This is a chronic problem. This is a new diagnosis. Pertinent negatives include no chest pain or shortness of breath. Current antihyperlipidemic treatment includes statins (started last visit - no follow up labs yet).  Recurrent DVT and PE - he thinks he had a DVT in 1998 and took heparin injections.  He had another DVT in 2005 with a PE and started on life long warfarin.  He does not think that he every was tested for a coagulation defect. It has never been suggested that he take a DOAC instead of warfarin.   Review of Systems  Constitutional: Negative for fever, chills, fatigue and unexpected weight change.  HENT: Negative for hearing loss, tinnitus and trouble swallowing.   Eyes: Negative for visual disturbance.  Respiratory: Negative for choking, chest tightness, shortness of breath and wheezing.   Cardiovascular: Negative for chest pain, palpitations and leg swelling.  Gastrointestinal: Negative for blood in stool.  Genitourinary: Negative for dysuria and hematuria.  Musculoskeletal: Negative for joint swelling, arthralgias and gait problem.  Skin: Negative for rash.  Allergic/Immunologic: Negative for environmental allergies.  Neurological: Negative for dizziness and headaches.  Hematological: Negative for adenopathy.  Psychiatric/Behavioral: Negative  for sleep disturbance and dysphoric mood. The patient is not nervous/anxious.     Patient Active Problem List   Diagnosis Date Noted  . 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 Anxiety 01/10/2015  . Benign colon polyp 01/10/2015  . Hypercholesteremia 01/10/2015  . Essential hypertension 01/10/2015  . Cannot sleep 01/10/2015  . Hx MRSA infection 01/10/2015  . Neutropenia (HCC) 01/10/2015  . Multiple pulmonary emboli (HCC) 01/10/2015  . Prostatitis 01/10/2015  . 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 Compulsive tobacco user syndrome 01/10/2015    Prior to Admission medications   Medication Sig Start Date End Date Taking? Authorizing Provider  lisinopril-hydrochlorothiazide (PRINZIDE,ZESTORETIC) 20-25 MG tablet Take 1 tablet by mouth daily. 10/28/15  Yes Lorie Phenix, MD  pravastatin (PRAVACHOL) 40 MG tablet Take 1 tablet (40 mg total) by mouth daily. Reported on 09/30/2015 09/30/15  Yes Lorie Phenix, MD  warfarin (COUMADIN) 7.5 MG tablet TAKE ONE TABLET BY MOUTH ONCE DAILY AS DIRECTED BY PHYSICIAN 04/15/15  Yes Lorie Phenix, MD    Allergies  Allergen Reactions  . Aspirin     Past Surgical History  Procedure Laterality Date  . Vascular surgery  2013    Leg  . Vasectomy      Social History  Substance Use Topics  . Smoking status: Current Every Day Smoker -- 0.50 packs/day    Types: Cigarettes  . Smokeless tobacco: Never Used  . Alcohol Use: 16.8 oz/week    28 Cans of beer per week     Comment: 4 beers a day     Medication list has been reviewed and updated.   Physical Exam  Constitutional: He is oriented to person, place, and time. He appears  well-developed. No distress.  HENT:  Head: Normocephalic and atraumatic.  Eyes: Pupils are equal, round, and reactive to light.  Neck: Normal range of motion. Neck supple. Carotid bruit is not present.  Cardiovascular: Normal rate, regular rhythm and normal heart sounds.   Pulmonary/Chest:  Effort normal and breath sounds normal. No respiratory distress. He has no wheezes. He has no rales.  Abdominal: Soft.  Musculoskeletal: Normal range of motion. He exhibits no edema or tenderness.  Neurological: He is alert and oriented to person, place, and time.  Skin: Skin is warm, dry and intact. No rash noted.  Psychiatric: He has a normal mood and affect. His behavior is normal. Thought content normal.  Nursing note and vitals reviewed.   BP 154/89 mmHg  Pulse 74  Resp 16  Ht 6' (1.829 m)  Wt 204 lb (92.534 kg)  BMI 27.66 kg/m2  SpO2 98%  Assessment and Plan: 1. Essential hypertension Improved - continue current therapy - lisinopril-hydrochlorothiazide (PRINZIDE,ZESTORETIC) 20-25 MG tablet; Take 1 tablet by mouth daily.  Dispense: 90 tablet; Refill: 1 - Comprehensive metabolic panel  2. 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  Dispense: 90 tablet; Refill: 1  3. Multiple pulmonary emboli (HCC) Start xarelto -  - rivaroxaban (XARELTO) 20 MG TABS tablet; Take 1 tablet (20 mg total) by mouth daily with supper.  Dispense: 90 tablet; Refill: 1  4. Hypercholesteremia Now on statin therapy - will check labs - pravastatin (PRAVACHOL) 40 MG tablet; Take 1 tablet (40 mg total) by mouth daily. Reported on 09/30/2015  Dispense: 90 tablet; Refill: 1 - Lipid panel  5. Smoker - Nurse to provide smoking / tobacco cessation education  6. Need for pneumococcal vaccination - Pneumococcal polysaccharide vaccine 23-valent greater than or equal to 2yo subcutaneous/IM   Bari EdwardLaura Sonal Dorwart, MD [facility]  03/02/2016

## 2016-03-02 NOTE — Patient Instructions (Signed)
Pneumococcal Polysaccharide Vaccine: What You Need to Know  1. Why get vaccinated?  Vaccination can protect older adults (and some children and younger adults) from pneumococcal disease.  Pneumococcal disease is caused by bacteria that can spread from person to person through close contact. It can cause ear infections, and it can also lead to more serious infections of the:   · Lungs (pneumonia),  · Blood (bacteremia), and  · Covering of the brain and spinal cord (meningitis). Meningitis can cause deafness and brain damage, and it can be fatal.  Anyone can get pneumococcal disease, but children under 2 years of age, people with certain medical conditions, adults over 65 years of age, and cigarette smokers are at the highest risk.  About 18,000 older adults die each year from pneumococcal disease in the [place].  Treatment of pneumococcal infections with penicillin and other drugs used to be more effective. But some strains of the disease have become resistant to these drugs. This makes prevention of the disease, through vaccination, even more important.  2. Pneumococcal polysaccharide vaccine (PPSV23)  Pneumococcal polysaccharide vaccine (PPSV23) protects against 23 types of pneumococcal bacteria. It will not prevent all pneumococcal disease.  PPSV23 is recommended for:  · All adults 65 years of age and older,  · Anyone 2 through 61 years of age with certain long-term health problems,  · Anyone 2 through 61 years of age with a weakened immune system,  · Adults 19 through 61 years of age who smoke cigarettes or have asthma.  Most people need only one dose of PPSV. A second dose is recommended for certain high-risk groups. People 65 and older should get a dose even if they have gotten one or more doses of the vaccine before they turned 65.  Your healthcare provider can give you more information about these recommendations.  Most healthy adults develop protection within 2 to 3 weeks of getting the shot.  3. Some  people should not get this vaccine  · Anyone who has had a life-threatening allergic reaction to PPSV should not get another dose.  · Anyone who has a severe allergy to any component of PPSV should not receive it. Tell your provider if you have any severe allergies.  · Anyone who is moderately or severely ill when the shot is scheduled may be asked to wait until they recover before getting the vaccine. Someone with a mild illness can usually be vaccinated.  · Children less than 2 years of age should not receive this vaccine.  · There is no evidence that PPSV is harmful to either a pregnant woman or to her fetus. However, as a precaution, women who need the vaccine should be vaccinated before becoming pregnant, if possible.  4. Risks of a vaccine reaction  With any medicine, including vaccines, there is a chance of side effects. These are usually mild and go away on their own, but serious reactions are also possible.  About half of people who get PPSV have mild side effects, such as redness or pain where the shot is given, which go away within about two days.  Less than 1 out of 100 people develop a fever, muscle aches, or more severe local reactions.  Problems that could happen after any vaccine:  · People sometimes faint after a medical procedure, including vaccination. Sitting or lying down for about 15 minutes can help prevent fainting, and injuries caused by a fall. Tell your doctor if you feel dizzy, or have vision changes or   ringing in the ears.  · Some people get severe pain in the shoulder and have difficulty moving the arm where a shot was given. This happens very rarely.  · Any medication can cause a severe allergic reaction. Such reactions from a vaccine are very rare, estimated at about 1 in a million doses, and would happen within a few minutes to a few hours after the vaccination.  As with any medicine, there is a very remote chance of a vaccine causing a serious injury or death.  The safety of  vaccines is always being monitored. For more information, visit: www.cdc.gov/vaccinesafety/  5. What if there is a serious reaction?  What should I look for?  Look for anything that concerns you, such as signs of a severe allergic reaction, very high fever, or unusual behavior.   Signs of a severe allergic reaction can include hives, swelling of the face and throat, difficulty breathing, a fast heartbeat, dizziness, and weakness. These would usually start a few minutes to a few hours after the vaccination.  What should I do?  If you think it is a severe allergic reaction or other emergency that can't wait, call 9-1-1 or get to the nearest hospital. Otherwise, call your doctor.  Afterward, the reaction should be reported to the Vaccine Adverse Event Reporting System (VAERS). Your doctor might file this report, or you can do it yourself through the VAERS web site at www.vaers.hhs.gov, or by calling 1-800-822-7967.   VAERS does not give medical advice.  6. How can I learn more?  · Ask your doctor. He or she can give you the vaccine package insert or suggest other sources of information.  · Call your local or state health department.  · Contact the Centers for Disease Control and Prevention (CDC):    Call 1-800-232-4636 (1-800-CDC-INFO) or    Visit CDC's website at www.cdc.gov/vaccines  CDC Pneumococcal Polysaccharide Vaccine VIS (12/07/13)     This information is not intended to replace advice given to you by your health care provider. Make sure you discuss any questions you have with your health care provider.     Document Released: 05/30/2006 Document Revised: 08/23/2014 Document Reviewed: 12/10/2013  Elsevier Interactive Patient Education ©2016 [employer]vier Inc[employer]

## 2016-03-03 LAB — COMPREHENSIVE METABOLIC PANEL
ALBUMIN: 4.7 g/dL (ref 3.6–4.8)
ALT: 41 IU/L (ref 0–44)
AST: 26 IU/L (ref 0–40)
Albumin/Globulin Ratio: 2.5 — ABNORMAL HIGH (ref 1.2–2.2)
Alkaline Phosphatase: 53 IU/L (ref 39–117)
BUN / CREAT RATIO: 22 (ref 10–24)
BUN: 22 mg/dL (ref 8–27)
Bilirubin Total: 0.3 mg/dL (ref 0.0–1.2)
CALCIUM: 9.3 mg/dL (ref 8.6–10.2)
CHLORIDE: 105 mmol/L (ref 96–106)
CO2: 21 mmol/L (ref 18–29)
CREATININE: 1.02 mg/dL (ref 0.76–1.27)
GFR, EST AFRICAN AMERICAN: 92 mL/min/{1.73_m2} (ref >59)
GFR, EST NON AFRICAN AMERICAN: 80 mL/min/{1.73_m2} (ref >59)
GLUCOSE: 96 mg/dL (ref 65–99)
Globulin, Total: 1.9 g/dL (ref 1.5–4.5)
Potassium: 4 mmol/L (ref 3.5–5.2)
Sodium: 145 mmol/L — ABNORMAL HIGH (ref 134–144)
TOTAL PROTEIN: 6.6 g/dL (ref 6.0–8.5)

## 2016-03-03 LAB — LIPID PANEL
Chol/HDL Ratio: 5.5 ratio units — ABNORMAL HIGH (ref 0.0–5.0)
Cholesterol, Total: 251 mg/dL — ABNORMAL HIGH (ref 100–199)
HDL: 46 mg/dL (ref >39)
Triglycerides: 481 mg/dL — ABNORMAL HIGH (ref 0–149)

## 2016-03-05 ENCOUNTER — Other Ambulatory Visit: Payer: Self-pay | Admitting: Internal Medicine

## 2016-03-05 MED ORDER — ROSUVASTATIN CALCIUM 20 MG PO TABS: 20.0000 mg | ORAL_TABLET | Freq: Every day | ORAL | Status: DC

## 2016-03-08 ENCOUNTER — Other Ambulatory Visit: Payer: Self-pay | Admitting: Internal Medicine

## 2016-05-26 ENCOUNTER — Other Ambulatory Visit: Payer: Self-pay | Admitting: Internal Medicine

## 2016-05-26 DIAGNOSIS — I825Z9 Chronic embolism and thrombosis of unspecified deep veins of unspecified distal lower extremity: Secondary | ICD-10-CM

## 2016-05-26 DIAGNOSIS — I2699 Other pulmonary embolism without acute cor pulmonale: Secondary | ICD-10-CM

## 2016-05-26 MED ORDER — WARFARIN SODIUM 7.5 MG PO TABS: 7.5000 mg | ORAL_TABLET | Freq: Every day | ORAL | 3 refills | Status: DC

## 2016-09-02 ENCOUNTER — Encounter: Payer: BLUE CROSS/BLUE SHIELD | Admitting: Internal Medicine

## 2017-04-06 ENCOUNTER — Encounter: Payer: Self-pay | Admitting: Internal Medicine

## 2017-04-06 ENCOUNTER — Ambulatory Visit (INDEPENDENT_AMBULATORY_CARE_PROVIDER_SITE_OTHER): Payer: BLUE CROSS/BLUE SHIELD | Admitting: Internal Medicine

## 2017-04-06 VITALS — BP 182/87 | HR 82 | Resp 16 | Ht 72.0 in | Wt 203.0 lb

## 2017-04-06 DIAGNOSIS — I2699 Other pulmonary embolism without acute cor pulmonale: Secondary | ICD-10-CM

## 2017-04-06 DIAGNOSIS — I10 Essential (primary) hypertension: Secondary | ICD-10-CM | POA: Diagnosis not present

## 2017-04-06 DIAGNOSIS — F17200 Nicotine dependence, unspecified, uncomplicated: Secondary | ICD-10-CM | POA: Diagnosis not present

## 2017-04-06 DIAGNOSIS — Z7901 Long term (current) use of anticoagulants: Secondary | ICD-10-CM

## 2017-04-06 DIAGNOSIS — E7800 Pure hypercholesterolemia, unspecified: Secondary | ICD-10-CM | POA: Diagnosis not present

## 2017-04-06 MED ORDER — ROSUVASTATIN CALCIUM 20 MG PO TABS: 20.0000 mg | ORAL_TABLET | Freq: Every day | ORAL | 1 refills | Status: DC

## 2017-04-06 MED ORDER — WARFARIN SODIUM 7.5 MG PO TABS: 7.5000 mg | ORAL_TABLET | Freq: Every day | ORAL | 1 refills | Status: DC

## 2017-04-06 MED ORDER — LISINOPRIL-HYDROCHLOROTHIAZIDE 20-25 MG PO TABS: 1.0000 | ORAL_TABLET | Freq: Every day | ORAL | 1 refills | Status: DC

## 2017-04-06 NOTE — Progress Notes (Signed)
Date:  04/06/2017   Name:  Tyler Estes   DOB:  10-Apr-1956   MRN:  [id number]   Chief Complaint: Coagulation Disorder (Took entire time but stopped other meds during loss of insurance); Hypertension (Had to stop Lisinopril for awhile but now has health insurance and can get this. ); and Hyperlipidemia (Had lost jobs and had no insurance but now has it so wants to go back on all meds that ghe had to pause on. )  Hypertension  This is a chronic problem. The problem is uncontrolled (since stopping BP meds a few months ago). Pertinent negatives include no chest pain, headaches, palpitations or shortness of breath.  Hyperlipidemia  This is a chronic problem. Pertinent negatives include no chest pain or shortness of breath. Treatments tried: previously on Crestor.   DVT and PE - on life long anticoagulation.  No current bleeding problems but no INR in many months.  No further sx of leg swelling, SOB or chest pains.  Tobacco - still smoking about 1/2 ppd.  Does not think he can quit completely right now.  Review of Systems  Constitutional: Negative for chills, fatigue, fever and unexpected weight change.  Respiratory: Negative for chest tightness, shortness of breath and wheezing.   Cardiovascular: Negative for chest pain, palpitations and leg swelling.  Gastrointestinal: Negative for abdominal pain, anal bleeding, blood in stool and constipation.  Genitourinary: Negative for hematuria.  Musculoskeletal: Negative for arthralgias.  Neurological: Negative for dizziness and headaches.  Hematological: Negative for adenopathy. Does not bruise/bleed easily.  Psychiatric/Behavioral: Negative for dysphoric mood and sleep disturbance.    Patient Active Problem List   Diagnosis Date Noted  . 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 Anxiety 01/10/2015  . Benign colon polyp 01/10/2015  . Hypercholesteremia 01/10/2015  . Essential hypertension  01/10/2015  . Cannot sleep 01/10/2015  . Hx MRSA infection 01/10/2015  . Neutropenia (HCC) 01/10/2015  . Multiple pulmonary emboli (HCC) 01/10/2015  . Prostatitis 01/10/2015  . 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 Compulsive tobacco user syndrome 01/10/2015    Prior to Admission medications   Medication Sig Start Date End Date Taking? Authorizing Provider  warfarin (COUMADIN) 7.5 MG tablet Take 1 tablet (7.5 mg total) by mouth daily. 05/26/16  Yes Reubin Milan, MD  lisinopril-hydrochlorothiazide (PRINZIDE,ZESTORETIC) 20-25 MG tablet Take 1 tablet by mouth daily. Patient not taking: Reported on 04/06/2017 03/02/16   Reubin Milan, MD  rosuvastatin (CRESTOR) 20 MG tablet Take 1 tablet (20 mg total) by mouth daily. Patient not taking: Reported on 04/06/2017 03/05/16   Reubin Milan, MD    Allergies  Allergen Reactions  . Aspirin     Past Surgical History:  Procedure Laterality Date  . VASCULAR SURGERY  2013   Leg  . VASECTOMY      Social History  Substance Use Topics  . Smoking status: Current Every Day Smoker    Packs/day: 0.50    Types: Cigarettes  . Smokeless tobacco: Never Used  . Alcohol use 16.8 oz/week    28 Cans of beer per week     Comment: 4 beers a day     Medication list has been reviewed and updated.   Physical Exam  Constitutional: He is oriented to person, place, and time. He appears well-developed. No distress.  HENT:  Head: Normocephalic and atraumatic.  Neck: Normal range of motion. Neck supple.  Cardiovascular: Normal rate, regular rhythm and normal heart sounds.  Pulmonary/Chest: Effort normal and breath sounds normal. No respiratory distress. He has no wheezes.  Musculoskeletal: Normal range of motion. He exhibits no edema or tenderness.  Neurological: He is alert and oriented to person, place, and time.  Skin: Skin is warm and dry. No rash noted.  Psychiatric: He has a normal mood and affect. His behavior is normal. Thought content normal.    Nursing note and vitals reviewed.   BP (!) 182/87   Pulse 82   Resp 16   Ht 6' (1.829 m)   Wt 203 lb (92.1 kg)   BMI 27.53 kg/m   Assessment and Plan: 1. Essential hypertension Resume medications - lisinopril-hydrochlorothiazide (PRINZIDE,ZESTORETIC) 20-25 MG tablet; Take 1 tablet by mouth daily.  Dispense: 90 tablet; Refill: 1 - Comprehensive metabolic panel  2. 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  Dispense: 90 tablet; Refill: 1  3. Hypercholesteremia Resume statin therapy - rosuvastatin (CRESTOR) 20 MG tablet; Take 1 tablet (20 mg total) by mouth daily.  Dispense: 90 tablet; Refill: 1  4. Compulsive tobacco user syndrome Options discussed - pt is not quite ready to quit completely  5. Multiple pulmonary emboli (HCC) - warfarin (COUMADIN) 7.5 MG tablet; Take 1 tablet (7.5 mg total) by mouth daily.  Dispense: 90 tablet; Refill: 1  6. Warfarin anticoagulation - Protime-INR   Meds ordered this encounter  Medications  . warfarin (COUMADIN) 7.5 MG tablet    Sig: Take 1 tablet (7.5 mg total) by mouth daily.    Dispense:  90 tablet    Refill:  1  . rosuvastatin (CRESTOR) 20 MG tablet    Sig: Take 1 tablet (20 mg total) by mouth daily.    Dispense:  90 tablet    Refill:  1  . lisinopril-hydrochlorothiazide (PRINZIDE,ZESTORETIC) 20-25 MG tablet    Sig: Take 1 tablet by mouth daily.    Dispense:  90 tablet    Refill:  1    Bari Edward, MD [facility]  04/06/2017

## 2017-04-07 LAB — COMPREHENSIVE METABOLIC PANEL
ALBUMIN: 4.4 g/dL (ref 3.6–4.8)
ALK PHOS: 67 IU/L (ref 39–117)
ALT: 41 IU/L (ref 0–44)
AST: 23 IU/L (ref 0–40)
Albumin/Globulin Ratio: 2.1 (ref 1.2–2.2)
BUN / CREAT RATIO: 19 (ref 10–24)
BUN: 17 mg/dL (ref 8–27)
Bilirubin Total: 0.4 mg/dL (ref 0.0–1.2)
CO2: 21 mmol/L (ref 20–29)
CREATININE: 0.9 mg/dL (ref 0.76–1.27)
Calcium: 9.3 mg/dL (ref 8.6–10.2)
Chloride: 106 mmol/L (ref 96–106)
GFR calc Af Amer: 106 mL/min/{1.73_m2} (ref >59)
GFR calc non Af Amer: 92 mL/min/{1.73_m2} (ref >59)
GLOBULIN, TOTAL: 2.1 g/dL (ref 1.5–4.5)
Glucose: 82 mg/dL (ref 65–99)
Potassium: 4.3 mmol/L (ref 3.5–5.2)
SODIUM: 142 mmol/L (ref 134–144)
Total Protein: 6.5 g/dL (ref 6.0–8.5)

## 2017-04-07 LAB — PROTIME-INR
INR: 1.5 — AB (ref 0.8–1.2)
Prothrombin Time: 15.3 s — ABNORMAL HIGH (ref 9.1–12.0)

## 2017-04-25 ENCOUNTER — Other Ambulatory Visit (INDEPENDENT_AMBULATORY_CARE_PROVIDER_SITE_OTHER): Payer: BLUE CROSS/BLUE SHIELD

## 2017-04-25 DIAGNOSIS — Z7901 Long term (current) use of anticoagulants: Secondary | ICD-10-CM

## 2017-04-25 DIAGNOSIS — I2699 Other pulmonary embolism without acute cor pulmonale: Secondary | ICD-10-CM

## 2017-04-25 NOTE — Patient Instructions (Signed)
NO CHARGE- Patient just picked up labs for INR.

## 2017-04-26 ENCOUNTER — Other Ambulatory Visit: Payer: Self-pay

## 2017-04-26 DIAGNOSIS — Z7901 Long term (current) use of anticoagulants: Secondary | ICD-10-CM

## 2017-04-26 DIAGNOSIS — I2699 Other pulmonary embolism without acute cor pulmonale: Secondary | ICD-10-CM

## 2017-04-26 LAB — PROTIME-INR
INR: 3.8 — AB (ref 0.8–1.2)
Prothrombin Time: 36.7 s — ABNORMAL HIGH (ref 9.1–12.0)

## 2017-04-26 NOTE — Progress Notes (Signed)
Spoke with pt and informed to stop a 1/2 tablet weekly. Pt stated it will be easier to take a half tablet on Thursday. Informed his INR is slightly high now. Will recheck in 2 weeks and place order up front for pick up.

## 2017-06-15 ENCOUNTER — Other Ambulatory Visit: Payer: Self-pay | Admitting: Internal Medicine

## 2017-06-15 DIAGNOSIS — E7800 Pure hypercholesterolemia, unspecified: Secondary | ICD-10-CM

## 2017-06-15 MED ORDER — ROSUVASTATIN CALCIUM 20 MG PO TABS: 20.0000 mg | ORAL_TABLET | Freq: Every day | ORAL | 1 refills | Status: DC

## 2017-10-12 ENCOUNTER — Encounter: Payer: Self-pay | Admitting: Internal Medicine

## 2017-10-12 ENCOUNTER — Ambulatory Visit (INDEPENDENT_AMBULATORY_CARE_PROVIDER_SITE_OTHER): Payer: Managed Care, Other (non HMO) | Admitting: Internal Medicine

## 2017-10-12 VITALS — BP 138/78 | HR 102 | Ht 72.0 in | Wt 202.0 lb

## 2017-10-12 DIAGNOSIS — Z86711 Personal history of pulmonary embolism: Secondary | ICD-10-CM

## 2017-10-12 DIAGNOSIS — Z1159 Encounter for screening for other viral diseases: Secondary | ICD-10-CM | POA: Diagnosis not present

## 2017-10-12 DIAGNOSIS — Z0001 Encounter for general adult medical examination with abnormal findings: Secondary | ICD-10-CM

## 2017-10-12 DIAGNOSIS — I2699 Other pulmonary embolism without acute cor pulmonale: Secondary | ICD-10-CM | POA: Diagnosis not present

## 2017-10-12 DIAGNOSIS — I10 Essential (primary) hypertension: Secondary | ICD-10-CM

## 2017-10-12 DIAGNOSIS — Z125 Encounter for screening for malignant neoplasm of prostate: Secondary | ICD-10-CM

## 2017-10-12 DIAGNOSIS — K635 Polyp of colon: Secondary | ICD-10-CM

## 2017-10-12 DIAGNOSIS — E7800 Pure hypercholesterolemia, unspecified: Secondary | ICD-10-CM

## 2017-10-12 DIAGNOSIS — Z0000 Encounter for general adult medical examination without abnormal findings: Secondary | ICD-10-CM

## 2017-10-12 LAB — POCT URINALYSIS DIPSTICK
Bilirubin, UA: NEGATIVE
Blood, UA: NEGATIVE
GLUCOSE UA: NEGATIVE
KETONES UA: NEGATIVE
LEUKOCYTES UA: NEGATIVE
NITRITE UA: NEGATIVE
PROTEIN UA: NEGATIVE
SPEC GRAV UA: 1.02 (ref 1.010–1.025)
Urobilinogen, UA: 0.2 E.U./dL
pH, UA: 5 (ref 5.0–8.0)

## 2017-10-12 MED ORDER — WARFARIN SODIUM 7.5 MG PO TABS: 7.5000 mg | ORAL_TABLET | Freq: Every day | ORAL | 1 refills | Status: DC

## 2017-10-12 MED ORDER — LISINOPRIL-HYDROCHLOROTHIAZIDE 20-25 MG PO TABS: 1.0000 | ORAL_TABLET | Freq: Every day | ORAL | 1 refills | Status: DC

## 2017-10-12 MED ORDER — ROSUVASTATIN CALCIUM 20 MG PO TABS: 20.0000 mg | ORAL_TABLET | Freq: Every day | ORAL | 1 refills | Status: DC

## 2017-10-12 NOTE — Progress Notes (Signed)
Date:  10/12/2017   Name:  Tyler Estes   DOB:  Oct 29, 1955   MRN:  [id number]   Chief Complaint: Annual Exam (Needs refills on all 3 medications.); Hypertension; and Hyperlipidemia Tyler MonacoGerald Estes is a 62 y.o. male who presents today for his Complete Annual Exam. He feels fairly well. He reports exercising none. He reports he is sleeping fairly well. His last colonoscopy in 2008.  Hypertension  This is a chronic problem. The problem is unchanged. The problem is controlled. Pertinent negatives include no chest pain, headaches, palpitations or shortness of breath.  Hyperlipidemia  Pertinent negatives include no chest pain, myalgias or shortness of breath.  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  He denies bleeding other than occasional nose bleeding which is minor.   Review of Systems  Constitutional: Negative for appetite change, chills, diaphoresis, fatigue and unexpected weight change.  HENT: Negative for hearing loss, tinnitus, trouble swallowing and voice change.   Eyes: Negative for visual disturbance.  Respiratory: Negative for choking, shortness of breath and wheezing.   Cardiovascular: Negative for chest pain, palpitations and leg swelling.  Gastrointestinal: Negative for abdominal pain, blood in stool, constipation and diarrhea.  Genitourinary: Negative for difficulty urinating, dysuria and frequency.  Musculoskeletal: Negative for arthralgias, back pain and myalgias.  Skin: Negative for color change and rash.  Neurological: Negative for dizziness, syncope and headaches.  Hematological: Negative for adenopathy.  Psychiatric/Behavioral: Negative for dysphoric mood and sleep disturbance.    Patient Active Problem List   Diagnosis Date Noted  . Benign colon polyp 01/10/2015  . Hypercholesteremia 01/10/2015  . Essential hypertension 01/10/2015  . Hx MRSA infection 01/10/2015  . History of pulmonary embolus (PE) 01/10/2015  . 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 Compulsive tobacco user  syndrome 01/10/2015    Prior to Admission medications   Medication Sig Start Date End Date Taking? Authorizing Provider  lisinopril-hydrochlorothiazide (PRINZIDE,ZESTORETIC) 20-25 MG tablet Take 1 tablet by mouth daily. 04/06/17  Yes Reubin MilanBerglund, Kerline Trahan H, MD  rosuvastatin (CRESTOR) 20 MG tablet Take 1 tablet (20 mg total) by mouth daily. 06/15/17  Yes Reubin MilanBerglund, Tristan Proto H, MD  warfarin (COUMADIN) 7.5 MG tablet Take 1 tablet (7.5 mg total) by mouth daily. 04/06/17  Yes Reubin MilanBerglund, Cavion Faiola H, MD    Allergies  Allergen Reactions  . Aspirin     Past Surgical History:  Procedure Laterality Date  . VASCULAR SURGERY  2013   Leg  . VASECTOMY      Social History   Tobacco Use  . Smoking status: Current Every Day Smoker    Packs/day: 0.50    Types: Cigarettes  . Smokeless tobacco: Never Used  Substance Use Topics  . Alcohol use: Yes    Alcohol/week: 16.8 oz    Types: 28 Cans of beer per week    Comment: 4 beers a day  . Drug use: No    Comment: Quit in 1997     Medication list has been reviewed and updated.  PHQ 2/9 Scores 04/06/2017 03/02/2016 09/30/2015  PHQ - 2 Score 0 0 0    Physical Exam  Constitutional: He is oriented to person, place, and time. He appears well-developed and well-nourished.  HENT:  Head: Normocephalic.  Right Ear: Tympanic membrane, external ear and ear canal normal.  Left Ear: Tympanic membrane, external ear and ear canal normal.  Nose: Nose normal.  Mouth/Throat: Uvula is midline and oropharynx is clear and moist.  Eyes: Conjunctivae and EOM are normal. Pupils are equal, round, and  reactive to light.  Neck: Normal range of motion. Neck supple. Carotid bruit is not present. No thyromegaly present.  Cardiovascular: Normal rate, regular rhythm, normal heart sounds and intact distal pulses.  Pulmonary/Chest: Effort normal and breath sounds normal. He has no wheezes. Right breast exhibits no mass. Left breast exhibits no mass.  Abdominal: Soft. Normal appearance  and bowel sounds are normal. There is no hepatosplenomegaly. There is no tenderness.  Musculoskeletal: Normal range of motion. He exhibits no edema or tenderness.  Lymphadenopathy:    He has no cervical adenopathy.  Neurological: He is alert and oriented to person, place, and time. He has normal reflexes.  Skin: Skin is warm, dry and intact.  Psychiatric: He has a normal mood and affect. His speech is normal and behavior is normal. Judgment and thought content normal.  Nursing note and vitals reviewed.   BP 138/78   Pulse (!) 102   Ht 6' (1.829 m)   Wt 202 lb (91.6 kg)   SpO2 97%   BMI 27.40 kg/m   Assessment and Plan: 1. Annual physical exam Normal exam Pt encouraged to cut back on alcohol and smoking - POCT urinalysis dipstick  2. Prostate cancer screening DRE deferred - PSA  3. Essential hypertension controlled - CBC with Differential/Platelet - Comprehensive metabolic panel - lisinopril-hydrochlorothiazide (PRINZIDE,ZESTORETIC) 20-25 MG tablet; Take 1 tablet by mouth daily.  Dispense: 90 tablet; Refill: 1  4. History of pulmonary embolus (PE) Continue warfarin - Protime-INR  5. Hypercholesteremia On statin therapy (has been out of meds for the past week) - Lipid panel - rosuvastatin (CRESTOR) 20 MG tablet; Take 1 tablet (20 mg total) by mouth daily.  Dispense: 90 tablet; Refill: 1  6. Benign colon polyp Due for 10 yr colonoscopy - Ambulatory referral to Gastroenterology  7. Need for hepatitis C screening test - Hepatitis C antibody  8. Multiple pulmonary emboli (HCC) - warfarin (COUMADIN) 7.5 MG tablet; Take 1 tablet (7.5 mg total) by mouth daily. And take an extra 1/2 tablet once a week  Dispense: 90 tablet; Refill: 1   Meds ordered this encounter  Medications  . lisinopril-hydrochlorothiazide (PRINZIDE,ZESTORETIC) 20-25 MG tablet    Sig: Take 1 tablet by mouth daily.    Dispense:  90 tablet    Refill:  1  . rosuvastatin (CRESTOR) 20 MG tablet    Sig:  Take 1 tablet (20 mg total) by mouth daily.    Dispense:  90 tablet    Refill:  1  . warfarin (COUMADIN) 7.5 MG tablet    Sig: Take 1 tablet (7.5 mg total) by mouth daily. And take an extra 1/2 tablet once a week    Dispense:  90 tablet    Refill:  1    Partially dictated using Animal nutritionist. Any errors are unintentional.  Bari Edward, MD [facility]  10/12/2017

## 2017-10-13 LAB — CBC WITH DIFFERENTIAL/PLATELET
Basophils Absolute: 0 10*3/uL (ref 0.0–0.2)
Basos: 1 %
EOS (ABSOLUTE): 0.1 10*3/uL (ref 0.0–0.4)
EOS: 1 %
HEMATOCRIT: 46.4 % (ref 37.5–51.0)
Hemoglobin: 16.6 g/dL (ref 13.0–17.7)
IMMATURE GRANULOCYTES: 0 %
Immature Grans (Abs): 0 10*3/uL (ref 0.0–0.1)
Lymphocytes Absolute: 1.5 10*3/uL (ref 0.7–3.1)
Lymphs: 34 %
MCH: 33.6 pg — ABNORMAL HIGH (ref 26.6–33.0)
MCHC: 35.8 g/dL — ABNORMAL HIGH (ref 31.5–35.7)
MCV: 94 fL (ref 79–97)
MONOS ABS: 0.5 10*3/uL (ref 0.1–0.9)
Monocytes: 12 %
NEUTROS PCT: 52 %
Neutrophils Absolute: 2.3 10*3/uL (ref 1.4–7.0)
PLATELETS: 212 10*3/uL (ref 150–379)
RBC: 4.94 x10E6/uL (ref 4.14–5.80)
RDW: 14.1 % (ref 12.3–15.4)
WBC: 4.4 10*3/uL (ref 3.4–10.8)

## 2017-10-13 LAB — LIPID PANEL
Chol/HDL Ratio: 5.7 ratio — ABNORMAL HIGH (ref 0.0–5.0)
Cholesterol, Total: 283 mg/dL — ABNORMAL HIGH (ref 100–199)
HDL: 50 mg/dL (ref >39)
LDL Calculated: 181 mg/dL — ABNORMAL HIGH (ref 0–99)
Triglycerides: 262 mg/dL — ABNORMAL HIGH (ref 0–149)
VLDL Cholesterol Cal: 52 mg/dL — ABNORMAL HIGH (ref 5–40)

## 2017-10-13 LAB — COMPREHENSIVE METABOLIC PANEL
ALT: 38 IU/L (ref 0–44)
AST: 25 IU/L (ref 0–40)
Albumin/Globulin Ratio: 2.4 — ABNORMAL HIGH (ref 1.2–2.2)
Albumin: 4.7 g/dL (ref 3.6–4.8)
Alkaline Phosphatase: 58 IU/L (ref 39–117)
BUN/Creatinine Ratio: 15 (ref 10–24)
BUN: 16 mg/dL (ref 8–27)
Bilirubin Total: 0.4 mg/dL (ref 0.0–1.2)
CALCIUM: 9.4 mg/dL (ref 8.6–10.2)
CO2: 22 mmol/L (ref 20–29)
CREATININE: 1.04 mg/dL (ref 0.76–1.27)
Chloride: 106 mmol/L (ref 96–106)
GFR calc Af Amer: 89 mL/min/{1.73_m2} (ref >59)
GFR, EST NON AFRICAN AMERICAN: 77 mL/min/{1.73_m2} (ref >59)
Globulin, Total: 2 g/dL (ref 1.5–4.5)
Glucose: 101 mg/dL — ABNORMAL HIGH (ref 65–99)
Potassium: 4.5 mmol/L (ref 3.5–5.2)
Sodium: 144 mmol/L (ref 134–144)
Total Protein: 6.7 g/dL (ref 6.0–8.5)

## 2017-10-13 LAB — HEPATITIS C ANTIBODY

## 2017-10-13 LAB — PROTIME-INR
INR: 2.6 — AB (ref 0.8–1.2)
Prothrombin Time: 25.9 s — ABNORMAL HIGH (ref 9.1–12.0)

## 2017-10-13 LAB — PSA: PROSTATE SPECIFIC AG, SERUM: 0.9 ng/mL (ref 0.0–4.0)

## 2018-01-26 ENCOUNTER — Encounter: Payer: Self-pay | Admitting: *Deleted

## 2018-01-27 ENCOUNTER — Encounter
Admission: RE | Disposition: A | Discharge status: Home / Self Care | Payer: Self-pay | Source: Ambulatory Visit | Attending: Gastroenterology

## 2018-01-27 ENCOUNTER — Encounter: Payer: Self-pay | Admitting: *Deleted

## 2018-01-27 ENCOUNTER — Ambulatory Visit: Payer: Managed Care, Other (non HMO) | Admitting: Certified Registered Nurse Anesthetist

## 2018-01-27 ENCOUNTER — Ambulatory Visit
Admission: RE | Admit: 2018-01-27 | Discharge: 2018-01-27 | Disposition: A | Discharge status: Home / Self Care | Payer: Managed Care, Other (non HMO) | Source: Ambulatory Visit | Attending: Gastroenterology | Admitting: Gastroenterology

## 2018-01-27 DIAGNOSIS — E7800 Pure hypercholesterolemia, unspecified: Secondary | ICD-10-CM | POA: Insufficient documentation

## 2018-01-27 DIAGNOSIS — Z86711 Personal history of pulmonary embolism: Secondary | ICD-10-CM | POA: Insufficient documentation

## 2018-01-27 DIAGNOSIS — Z79899 Other long term (current) drug therapy: Secondary | ICD-10-CM | POA: Insufficient documentation

## 2018-01-27 DIAGNOSIS — K621 Rectal polyp: Secondary | ICD-10-CM | POA: Diagnosis not present

## 2018-01-27 DIAGNOSIS — Z886 Allergy status to analgesic agent status: Secondary | ICD-10-CM | POA: Diagnosis not present

## 2018-01-27 DIAGNOSIS — K644 Residual hemorrhoidal skin tags: Secondary | ICD-10-CM | POA: Diagnosis not present

## 2018-01-27 DIAGNOSIS — I739 Peripheral vascular disease, unspecified: Secondary | ICD-10-CM | POA: Diagnosis not present

## 2018-01-27 DIAGNOSIS — M6208 Separation of muscle (nontraumatic), other site: Secondary | ICD-10-CM | POA: Diagnosis not present

## 2018-01-27 DIAGNOSIS — I10 Essential (primary) hypertension: Secondary | ICD-10-CM | POA: Diagnosis not present

## 2018-01-27 DIAGNOSIS — Z1211 Encounter for screening for malignant neoplasm of colon: Secondary | ICD-10-CM | POA: Diagnosis present

## 2018-01-27 DIAGNOSIS — K648 Other hemorrhoids: Secondary | ICD-10-CM | POA: Insufficient documentation

## 2018-01-27 DIAGNOSIS — K5730 Diverticulosis of large intestine without perforation or abscess without bleeding: Secondary | ICD-10-CM | POA: Diagnosis not present

## 2018-01-27 DIAGNOSIS — Z7901 Long term (current) use of anticoagulants: Secondary | ICD-10-CM | POA: Diagnosis not present

## 2018-01-27 HISTORY — DX: Personal history of pulmonary embolism: Z86.711

## 2018-01-27 HISTORY — DX: Essential (primary) hypertension: I10

## 2018-01-27 HISTORY — PX: COLONOSCOPY WITH PROPOFOL: SHX5780

## 2018-01-27 HISTORY — DX: Pure hypercholesterolemia, unspecified: E78.00

## 2018-01-27 LAB — PROTIME-INR
INR: 1.07
Prothrombin Time: 13.8 seconds (ref 11.4–15.2)

## 2018-01-27 SURGERY — COLONOSCOPY WITH PROPOFOL
Anesthesia: General

## 2018-01-27 MED ORDER — SODIUM CHLORIDE 0.9 % IV SOLN
INTRAVENOUS | Status: DC
  Administered 2018-01-27: 1000 mL via INTRAVENOUS

## 2018-01-27 MED ORDER — LIDOCAINE HCL (PF) 2 % IJ SOLN
INTRAMUSCULAR | Status: AC
  Filled 2018-01-27: qty 10

## 2018-01-27 MED ORDER — PROPOFOL 500 MG/50ML IV EMUL
INTRAVENOUS | Status: DC | PRN
  Administered 2018-01-27: 140 ug/kg/min via INTRAVENOUS

## 2018-01-27 MED ORDER — PROPOFOL 500 MG/50ML IV EMUL
INTRAVENOUS | Status: AC
  Filled 2018-01-27: qty 50

## 2018-01-27 MED ORDER — PROPOFOL 10 MG/ML IV BOLUS
INTRAVENOUS | Status: DC | PRN
Start: 2018-01-27 — End: 2018-01-27
  Administered 2018-01-27: 100 mg via INTRAVENOUS

## 2018-01-27 MED ORDER — LIDOCAINE HCL (CARDIAC) PF 100 MG/5ML IV SOSY
PREFILLED_SYRINGE | INTRAVENOUS | Status: DC | PRN
  Administered 2018-01-27: 50 mg via INTRAVENOUS

## 2018-01-27 NOTE — H&P (Signed)
Outpatient short stay form Pre-procedure 01/27/2018 8:51 AM Tyler Estes Tyler Pfluger MD  Primary Physician: Dr. Bari EdwardLaura Estes  Reason for visit: Colonoscopy  History of present illness: Patient is a 62 year old male presenting today for colon cancer screening procedure.  He tolerated his prep well.  He does take Coumadin that has been held.  His INR today was 1.07.  Does have a history of pulmonary emboli.  Takes no other blood thinning agent or aspirin product.    Current Facility-Administered Medications:  .  0.9 %  sodium chloride infusion, , Intravenous, Continuous, Tyler Estes, Tyler Estes U, MD, Last Rate: 20 mL/hr at 01/27/18 0850, 1,000 mL at 01/27/18 0850  Medications Prior to Admission  Medication Sig Dispense Refill Last Dose  . lisinopril-hydrochlorothiazide (PRINZIDE,ZESTORETIC) 20-25 MG tablet Take 1 tablet by mouth daily. 90 tablet 1 01/26/2018 at Unknown time  . polyethylene glycol-electrolytes (NULYTELY/GOLYTELY) 420 g solution Take 4,000 mLs by mouth once.   01/26/2018 at Unknown time  . rosuvastatin (CRESTOR) 20 MG tablet Take 1 tablet (20 mg total) by mouth daily. 90 tablet 1 01/26/2018 at Unknown time  . warfarin (COUMADIN) 7.5 MG tablet Take 1 tablet (7.5 mg total) by mouth daily. And take an extra 1/2 tablet once a week 90 tablet 1 Past Week at Unknown time     Allergies  Allergen Reactions  . Aspirin      Past Medical History:  Diagnosis Date  . High cholesterol   . Hx pulmonary embolism   . Hypertension     Review of systems:      Physical Exam    Heart and lungs: Regular rate and rhythm without rub or gallop, lungs are bilaterally clear.    HEENT: Normocephalic atraumatic eyes are anicteric    Other:    Pertinant exam for procedure: Soft nontender nondistended bowel sounds positive normoactive.  There is a diastases recti noted in the more so lower half of the epigastrium.  Nontender.    Planned proceedures: Colonoscopy and indicated  procedures.      Tyler Estes Tyler Decaire, MD Gastroenterology 01/27/2018  8:51 AM

## 2018-01-27 NOTE — Transfer of Care (Signed)
Immediate Anesthesia Transfer of Care Note  Patient: Lorel MonacoGerald Burggraf  Procedure(s) Performed: COLONOSCOPY WITH PROPOFOL (N/A )  Patient Location: PACU and Endoscopy Unit  Anesthesia Type:General  Level of Consciousness: asleep  Airway & Oxygen Therapy: Patient Spontanous Breathing and Patient connected to nasal cannula oxygen  Post-op Assessment: Report given to RN and Post -op Vital signs reviewed and stable  Post vital signs: Reviewed and stable  Last Vitals:  Vitals Value Taken Time  BP 112/96 01/27/2018  9:35 AM  Temp    Pulse 70 01/27/2018  9:35 AM  Resp 26 01/27/2018  9:35 AM  SpO2 91 % 01/27/2018  9:35 AM  Vitals shown include unvalidated device data.  Last Pain:  Vitals:   01/27/18 0751  TempSrc: Tympanic  PainSc: 0-No pain         Complications: No apparent anesthesia complications

## 2018-01-27 NOTE — Op Note (Signed)
[facility] Gastroenterology Patient Name: Tyler Estes Procedure Date: 01/27/2018 9:05 AM MRN: [id number] Account #: [id number] Date of Birth: 03-02-1956 Admit Type: Outpatient Age: 62 Room: [facility] ENDO ROOM 4 Gender: Male Note Status: Finalized Procedure:            Colonoscopy Indications:          Screening for colorectal malignant neoplasm Providers:            Christena Deem, MD Referring MD:         Bari Edward, MD (Referring MD) Medicines:            Monitored Anesthesia Care Complications:        No immediate complications. Procedure:            Pre-Anesthesia Assessment:                       - ASA Grade Assessment: III - A patient with severe                        systemic disease.                       After obtaining informed consent, the colonoscope was                        passed under direct vision. Throughout the procedure,                        the patient's blood pressure, pulse, and oxygen                        saturations were monitored continuously. The                        Colonoscope was introduced through the anus and                        advanced to the the cecum, identified by appendiceal                        orifice and ileocecal valve. The colonoscopy was                        performed without difficulty. The patient tolerated the                        procedure well. The quality of the bowel preparation                        was fair. Findings:      A 6 mm polyp was found in the rectum. The polyp was sessile. The polyp       was removed with a cold snare. Resection and retrieval were complete.      A 2 mm polyp was found in the rectum. The polyp was sessile. The polyp       was removed with a cold biopsy forceps. Resection and retrieval were       complete.      A few small-mouthed diverticula were found in the sigmoid colon and       descending colon.      Non-bleeding  external and internal hemorrhoids were  found during       retroflexion, during perianal exam and during anoscopy. The hemorrhoids       were small and Grade II (internal hemorrhoids that prolapse but reduce       spontaneously). Impression:           - Preparation of the colon was fair.                       - One 6 mm polyp in the rectum, removed with a cold                        snare. Resected and retrieved.                       - One 2 mm polyp in the rectum, removed with a cold                        biopsy forceps. Resected and retrieved.                       - Diverticulosis in the sigmoid colon and in the                        descending colon.                       - External and internal hemorrhoids. Recommendation:       - Await pathology results.                       - Telephone GI clinic for pathology results in 1 week. Procedure Code(s):    --- Professional ---                       [id number], Colonoscopy, flexible; with removal of tumor(s),                        polyp(s), or other lesion(s) by snare technique                       45380, 59, Colonoscopy, flexible; with biopsy, single                        or multiple Diagnosis Code(s):    --- Professional ---                       Z12.11, Encounter for screening for malignant neoplasm                        of colon                       K64.1, Second degree hemorrhoids                       K62.1, Rectal polyp                       K57.30, Diverticulosis of large intestine without                        perforation  or abscess without bleeding CPT copyright 2017 American Medical Association. All rights reserved. The codes documented in this report are preliminary and upon coder review may  be revised to meet current compliance requirements. Christena Deem, MD 01/27/2018 9:34:06 AM This report has been signed electronically. Number of Addenda: 0 Note Initiated On: 01/27/2018 9:05 AM Scope Withdrawal Time: 0 hours 9 minutes 56 seconds  Total Procedure  Duration: 0 hours 19 minutes 23 seconds       [facility]

## 2018-01-27 NOTE — Anesthesia Preprocedure Evaluation (Signed)
Anesthesia Evaluation  Patient identified by MRN, date of birth, ID band Patient awake    Reviewed: Allergy & Precautions, H&P , NPO status , Patient's Chart, lab work & pertinent test results, reviewed documented beta blocker date and time   Airway Mallampati: II   Neck ROM: full    Dental  (+) Poor Dentition, Teeth Intact   Pulmonary neg pulmonary ROS, Current Smoker,    Pulmonary exam normal        Cardiovascular Exercise Tolerance: Poor hypertension, On Medications + Peripheral Vascular Disease  negative cardio ROS Normal cardiovascular exam Rhythm:regular Rate:Normal     Neuro/Psych negative neurological ROS  negative psych ROS   GI/Hepatic negative GI ROS, Neg liver ROS,   Endo/Other  negative endocrine ROS  Renal/GU negative Renal ROS  negative genitourinary   Musculoskeletal   Abdominal   Peds  Hematology negative hematology ROS (+)   Anesthesia Other Findings Past Medical History: No date: High cholesterol No date: Hx pulmonary embolism No date: Hypertension Past Surgical History: No date: COLONOSCOPY 2013: VASCULAR SURGERY     Comment:  Leg No date: VASECTOMY BMI    Body Mass Index:  27.80 kg/m     Reproductive/Obstetrics negative OB ROS                             Anesthesia Physical Anesthesia Plan  ASA: III  Anesthesia Plan: General   Post-op Pain Management:    Induction:   PONV Risk Score and Plan:   Airway Management Planned:   Additional Equipment:   Intra-op Plan:   Post-operative Plan:   Informed Consent: I have reviewed the patients History and Physical, chart, labs and discussed the procedure including the risks, benefits and alternatives for the proposed anesthesia with the patient or authorized representative who has indicated his/her understanding and acceptance.   Dental Advisory Given  Plan Discussed with: CRNA  Anesthesia Plan  Comments:         Anesthesia Quick Evaluation

## 2018-01-27 NOTE — Anesthesia Post-op Follow-up Note (Signed)
Anesthesia QCDR form completed.        

## 2018-01-29 NOTE — Anesthesia Postprocedure Evaluation (Signed)
Anesthesia Post Note  Patient: Lorel MonacoGerald Domine  Procedure(s) Performed: COLONOSCOPY WITH PROPOFOL (N/A )  Patient location during evaluation: PACU Anesthesia Type: General Level of consciousness: awake and alert Pain management: pain level controlled Vital Signs Assessment: post-procedure vital signs reviewed and stable Respiratory status: spontaneous breathing, nonlabored ventilation, respiratory function stable and patient connected to nasal cannula oxygen Cardiovascular status: blood pressure returned to baseline and stable Postop Assessment: no apparent nausea or vomiting Anesthetic complications: no     Last Vitals:  Vitals:   01/27/18 0936 01/27/18 0945  BP: (!) 112/96 106/74  Pulse: 70 66  Resp: (!) 26 14  Temp:    SpO2: 91% 92%    Last Pain:  Vitals:   01/27/18 0945  TempSrc:   PainSc: 0-No pain                 Yevette EdwardsJames G Adams

## 2018-01-30 LAB — SURGICAL PATHOLOGY

## 2018-02-01 ENCOUNTER — Encounter: Payer: Self-pay | Admitting: Gastroenterology

## 2018-04-09 ENCOUNTER — Ambulatory Visit
Admission: EM | Admit: 2018-04-09 | Discharge: 2018-04-09 | Disposition: A | Discharge status: Home / Self Care | Payer: Managed Care, Other (non HMO) | Attending: Family Medicine | Admitting: Family Medicine

## 2018-04-09 ENCOUNTER — Ambulatory Visit (INDEPENDENT_AMBULATORY_CARE_PROVIDER_SITE_OTHER): Payer: Managed Care, Other (non HMO)

## 2018-04-09 DIAGNOSIS — J209 Acute bronchitis, unspecified: Secondary | ICD-10-CM | POA: Diagnosis not present

## 2018-04-09 MED ORDER — HYDROCOD POLST-CPM POLST ER 10-8 MG/5ML PO SUER: 5.0000 mL | Freq: Every evening | ORAL | 0 refills | Status: DC | PRN

## 2018-04-09 MED ORDER — PREDNISONE 50 MG PO TABS: ORAL_TABLET | ORAL | 0 refills | Status: DC

## 2018-04-09 MED ORDER — AZITHROMYCIN 250 MG PO TABS: ORAL_TABLET | ORAL | 0 refills | Status: DC

## 2018-04-09 NOTE — ED Provider Notes (Signed)
[facility]    CSN: [id number] Arrival date & time: 04/09/18  1029  History   Chief Complaint Chief Complaint  Patient presents with  . Cough   HPI  62 year old male presents with cough.  Patient reports that he has had a productive cough since Friday.  Associated congestion.  Worsening.  Severe.  Interferes with sleep at night.  Patient reports that he is short of breath and is also experiencing wheezing.  Intermittent body aches.  Has also had headache.  Has taken over-the-counter Robitussin without improvement.  No known exacerbating factors.  No other associated symptoms.  No reported sick contacts.  No other complaints.  Of note, patient is a long time smoker.  Does not carry a diagnosis of COPD  PMH, Surgical Hx, Family Hx, Social History reviewed and updated as below.  Past Medical History:  Diagnosis Date  . High cholesterol   . Hx pulmonary embolism   . Hypertension     Patient Active Problem List   Diagnosis Date Noted  . Benign colon polyp 01/10/2015  . Hypercholesteremia 01/10/2015  . Essential hypertension 01/10/2015  . Hx MRSA infection 01/10/2015  . History of pulmonary embolus (PE) 01/10/2015  . 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 Compulsive tobacco user syndrome 01/10/2015    Past Surgical History:  Procedure Laterality Date  . COLONOSCOPY    . COLONOSCOPY WITH PROPOFOL N/A 01/27/2018   Procedure: COLONOSCOPY WITH PROPOFOL;  Surgeon: Christena DeemSkulskie, Martin U, MD;  [facility];  Service: Endoscopy;  Laterality: N/A;  . VASCULAR SURGERY  2013   Leg  . VASECTOMY     Family History Family History  Problem Relation Age of Onset  . Hyperlipidemia Mother   . Heart disease Father   . Mesothelioma Father   . Dementia Sister     Social History Social History   Tobacco Use  . Smoking status: Current Every Day Smoker    Packs/day: 0.50    Types: Cigarettes  . Smokeless tobacco: Never Used  Substance Use Topics  . Alcohol use: Yes   Alcohol/week: 28.0 standard drinks    Types: 28 Cans of beer per week    Comment: 4 beers a day  . Drug use: No    Comment: Quit in 1997    Allergies   Aspirin  Review of Systems Review of Systems  Constitutional: Negative for fever.  Respiratory: Positive for cough and shortness of breath.   Musculoskeletal:       Bodyaches.  Neurological: Positive for headaches.   Physical Exam Triage Vital Signs ED Triage Vitals  Enc Vitals Group     BP 04/09/18 1035 102/73     Pulse Rate 04/09/18 1035 94     Resp 04/09/18 1035 18     Temp 04/09/18 1035 97.7 F (36.5 C)     Temp Source 04/09/18 1035 Oral     SpO2 04/09/18 1035 95 %     Weight 04/09/18 1038 200 lb (90.7 kg)     Height --      Head Circumference --      Peak Flow --      Pain Score 04/09/18 1038 0     Pain Loc --      Pain Edu? --      Excl. in GC? --    Updated Vital Signs BP 102/73 (BP Location: Left Arm)   Pulse 94   Temp 97.7 F (36.5 C) (Oral)   Resp 18   Wt 90.7 kg  SpO2 95%   BMI 27.12 kg/m   Visual Acuity Right Eye Distance:   Left Eye Distance:   Bilateral Distance:    Right Eye Near:   Left Eye Near:    Bilateral Near:     Physical Exam  Constitutional: He is oriented to person, place, and time. He appears well-developed. No distress.  HENT:  Head: Normocephalic and atraumatic.  Mouth/Throat: Oropharynx is clear and moist.  Cardiovascular: Normal rate and regular rhythm.  Pulmonary/Chest: Effort normal and breath sounds normal. He has no wheezes. He has no rales.  Neurological: He is alert and oriented to person, place, and time.  Psychiatric: He has a normal mood and affect. His behavior is normal.  Nursing note and vitals reviewed.  UC Treatments / Results  Labs (all labs ordered are listed, but only abnormal results are displayed) Labs Reviewed - No data to display  EKG None  Radiology Dg Chest 2 View  Result Date: 04/09/2018 CLINICAL DATA:  Productive cough for 3 days  EXAM: CHEST - 2 VIEW COMPARISON:  Report from 10/07/2003 FINDINGS: Mildly over exposed radiograph with some reduced definition of pulmonary markings. Cardiac and mediastinal margins appear normal. The lungs appear clear. No pleural effusion. No bony abnormality identified. IMPRESSION: 1.  No significant abnormality identified. Electronically Signed   By: Gaylyn Rong M.D.   On: 04/09/2018 11:21    Procedures Procedures (including critical care time)  Medications Ordered in UC Medications - No data to display  Initial Impression / Assessment and Plan / UC Course  I have reviewed the triage vital signs and the nursing notes.  Pertinent labs & imaging results that were available during my care of the patient were reviewed by me and considered in my medical decision making (see chart for details).    62 year old male presents with likely the beginnings of acute bronchitis.  Patient likely has underlying COPD given long-standing tobacco abuse.  As a result, I am treating him with azithromycin in addition to prednisone.  Tussionex for cough.  Advised him to get his INR checked this week.  Final Clinical Impressions(s) / UC Diagnoses   Final diagnoses:  Acute bronchitis, unspecified organism     Discharge Instructions     Meds as prescribed.  Please be seen this week for INR.  Take care  Dr. Adriana Simas    ED Prescriptions    Medication Sig Dispense Auth. Provider   predniSONE (DELTASONE) 50 MG tablet 1 tablet daily x 5 days 5 tablet Nikos Anglemyer G, DO   azithromycin (ZITHROMAX) 250 MG tablet 2 tablets on day 1, then 1 tablet daily on days 2-5. 6 tablet Niklaus Mamaril G, DO   chlorpheniramine-HYDROcodone (TUSSIONEX PENNKINETIC ER) 10-8 MG/5ML SUER Take 5 mLs by mouth at bedtime as needed. 60 mL Tommie Sams, DO     Controlled Substance Prescriptions [place] Controlled Substance Registry consulted? No   Tommie Sams, [place] 04/09/18 1143

## 2018-04-09 NOTE — ED Triage Notes (Signed)
Pt here for productive cough for [redacted] week Along with wheezing, sob, body aches off and on, headache when coughing, runny nose and has been taking otc robitussin.

## 2018-04-09 NOTE — Discharge Instructions (Signed)
Meds as prescribed.  Please be seen this week for INR.  Take care  Dr. Adriana Simasook

## 2018-04-10 ENCOUNTER — Ambulatory Visit (INDEPENDENT_AMBULATORY_CARE_PROVIDER_SITE_OTHER): Payer: Managed Care, Other (non HMO) | Admitting: Internal Medicine

## 2018-04-10 ENCOUNTER — Encounter: Payer: Self-pay | Admitting: Internal Medicine

## 2018-04-10 VITALS — BP 112/66 | HR 92 | Ht 72.0 in | Wt 194.0 lb

## 2018-04-10 DIAGNOSIS — I2699 Other pulmonary embolism without acute cor pulmonale: Secondary | ICD-10-CM | POA: Diagnosis not present

## 2018-04-10 DIAGNOSIS — E7800 Pure hypercholesterolemia, unspecified: Secondary | ICD-10-CM

## 2018-04-10 DIAGNOSIS — I10 Essential (primary) hypertension: Secondary | ICD-10-CM

## 2018-04-10 DIAGNOSIS — J40 Bronchitis, not specified as acute or chronic: Secondary | ICD-10-CM

## 2018-04-10 MED ORDER — WARFARIN SODIUM 7.5 MG PO TABS: 7.5000 mg | ORAL_TABLET | Freq: Every day | ORAL | 1 refills | Status: DC

## 2018-04-10 MED ORDER — ROSUVASTATIN CALCIUM 20 MG PO TABS: 20.0000 mg | ORAL_TABLET | Freq: Every day | ORAL | 1 refills | Status: DC

## 2018-04-10 MED ORDER — LISINOPRIL-HYDROCHLOROTHIAZIDE 20-25 MG PO TABS: 1.0000 | ORAL_TABLET | Freq: Every day | ORAL | 1 refills | Status: DC

## 2018-04-10 NOTE — Progress Notes (Signed)
Date:  04/10/2018   Name:  Tyler Estes   DOB:  1956/06/10   MRN:  [id number]   Chief Complaint: Hypertension Hypertension  This is a chronic problem. The problem is controlled. Associated symptoms include shortness of breath. Pertinent negatives include no chest pain, headaches or palpitations. Past treatments include ACE inhibitors. The current treatment provides significant improvement.  Cough  This is a new problem. The current episode started in the past 7 days (seen in ED yesterday and prescribed zpak, prednisone taper and cough syrup). Associated symptoms include shortness of breath. Pertinent negatives include no chest pain, chills, fever, headaches or wheezing.      Review of Systems  Constitutional: Negative for chills, fatigue and fever.  Respiratory: Positive for cough and shortness of breath. Negative for chest tightness and wheezing.   Cardiovascular: Negative for chest pain and palpitations.  Neurological: Negative for dizziness and headaches.  Psychiatric/Behavioral: Negative for sleep disturbance.    Patient Active Problem List   Diagnosis Date Noted  . Benign colon polyp 01/10/2015  . Hypercholesteremia 01/10/2015  . Essential hypertension 01/10/2015  . Hx MRSA infection 01/10/2015  . History of pulmonary embolus (PE) 01/10/2015  . 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 Compulsive tobacco user syndrome 01/10/2015    Allergies  Allergen Reactions  . Aspirin     Past Surgical History:  Procedure Laterality Date  . COLONOSCOPY    . COLONOSCOPY WITH PROPOFOL N/A 01/27/2018   Procedure: COLONOSCOPY WITH PROPOFOL;  Surgeon: Christena DeemSkulskie, Martin U, MD;  [facility] END[facility]COPY;  Service: Endoscopy;  Laterality: N/A;  . VASCULAR SURGERY  2013   Leg  . VASECTOMY      Social History   Tobacco Use  . Smoking status: Current Every Day Smoker    Packs/day: 0.50    Types: Cigarettes  . Smokeless tobacco: Never Used  Substance Use Topics  . Alcohol use: Yes   Alcohol/week: 28.0 standard drinks    Types: 28 Cans of beer per week    Comment: 4 beers a day  . Drug use: No    Comment: Quit in 1997     Medication list has been reviewed and updated.  Current Meds  Medication Sig  . azithromycin (ZITHROMAX) 250 MG tablet 2 tablets on day 1, then 1 tablet daily on days 2-5.  . chlorpheniramine-HYDROcodone (TUSSIONEX PENNKINETIC ER) 10-8 MG/5ML SUER Take 5 mLs by mouth at bedtime as needed.  Marland Kitchen. lisinopril-hydrochlorothiazide (PRINZIDE,ZESTORETIC) 20-25 MG tablet Take 1 tablet by mouth daily.  . polyethylene glycol-electrolytes (NULYTELY/GOLYTELY) 420 g solution Take 4,000 mLs by mouth once.  . predniSONE (DELTASONE) 50 MG tablet 1 tablet daily x 5 days  . rosuvastatin (CRESTOR) 20 MG tablet Take 1 tablet (20 mg total) by mouth daily.  Marland Kitchen. warfarin (COUMADIN) 7.5 MG tablet Take 1 tablet (7.5 mg total) by mouth daily. And take an extra 1/2 tablet once a week  . [DISCONTINUED] lisinopril-hydrochlorothiazide (PRINZIDE,ZESTORETIC) 20-25 MG tablet Take 1 tablet by mouth daily.  . [DISCONTINUED] rosuvastatin (CRESTOR) 20 MG tablet Take 1 tablet (20 mg total) by mouth daily.  . [DISCONTINUED] warfarin (COUMADIN) 7.5 MG tablet Take 1 tablet (7.5 mg total) by mouth daily. And take an extra 1/2 tablet once a week    PHQ 2/9 Scores 04/10/2018 04/06/2017 03/02/2016 09/30/2015  PHQ - 2 Score 0 0 0 0    Physical Exam  Constitutional: He is oriented to person, place, and time. He appears well-developed. No distress.  HENT:  Head: Normocephalic and atraumatic.  Cardiovascular: Normal rate, regular rhythm, normal heart sounds and normal pulses.  Pulmonary/Chest: Effort normal. No respiratory distress. He has decreased breath sounds. He has no wheezes. He has no rhonchi.  Musculoskeletal: Normal range of motion.  Neurological: He is alert and oriented to person, place, and time.  Skin: Skin is warm and dry. No rash noted.  Psychiatric: He has a normal mood and affect.  His behavior is normal. Thought content normal.  Nursing note and vitals reviewed.   BP 112/66 (BP Location: Right Arm, Patient Position: Sitting, Cuff Size: Normal)   Pulse 92   Ht 6' (1.829 m)   Wt 194 lb (88 kg)   SpO2 94%   BMI 26.31 kg/m   Assessment and Plan: 1. Essential hypertension controlled - lisinopril-hydrochlorothiazide (PRINZIDE,ZESTORETIC) 20-25 MG tablet; Take 1 tablet by mouth daily.  Dispense: 90 tablet; Refill: 1  2. Bronchitis Improving - finish zpak and prednisone Work on quitting smoking completely  3. Hypercholesteremia - rosuvastatin (CRESTOR) 20 MG tablet; Take 1 tablet (20 mg total) by mouth daily.  Dispense: 90 tablet; Refill: 1  4. Multiple pulmonary emboli (HCC) - warfarin (COUMADIN) 7.5 MG tablet; Take 1 tablet (7.5 mg total) by mouth daily. And take an extra 1/2 tablet once a week  Dispense: 90 tablet; Refill: 1   Meds ordered this encounter  Medications  . lisinopril-hydrochlorothiazide (PRINZIDE,ZESTORETIC) 20-25 MG tablet    Sig: Take 1 tablet by mouth daily.    Dispense:  90 tablet    Refill:  1  . rosuvastatin (CRESTOR) 20 MG tablet    Sig: Take 1 tablet (20 mg total) by mouth daily.    Dispense:  90 tablet    Refill:  1  . warfarin (COUMADIN) 7.5 MG tablet    Sig: Take 1 tablet (7.5 mg total) by mouth daily. And take an extra 1/2 tablet once a week    Dispense:  90 tablet    Refill:  1    Partially dictated using Animal nutritionist. Any errors are unintentional.  Bari Edward, MD [facility]  04/10/2018

## 2018-04-11 ENCOUNTER — Ambulatory Visit: Payer: Managed Care, Other (non HMO) | Admitting: Internal Medicine

## 2018-06-30 ENCOUNTER — Encounter: Payer: Self-pay | Admitting: Internal Medicine

## 2018-06-30 ENCOUNTER — Ambulatory Visit (INDEPENDENT_AMBULATORY_CARE_PROVIDER_SITE_OTHER): Payer: Managed Care, Other (non HMO) | Admitting: Internal Medicine

## 2018-06-30 VITALS — BP 112/68 | HR 108 | Temp 99.0°F | Ht 72.0 in | Wt 198.8 lb

## 2018-06-30 DIAGNOSIS — H6690 Otitis media, unspecified, unspecified ear: Secondary | ICD-10-CM

## 2018-06-30 DIAGNOSIS — J069 Acute upper respiratory infection, unspecified: Secondary | ICD-10-CM

## 2018-06-30 LAB — POCT INFLUENZA A/B
Influenza A, POC: NEGATIVE
Influenza B, POC: NEGATIVE

## 2018-06-30 MED ORDER — GUAIFENESIN-CODEINE 100-10 MG/5ML PO SYRP: 5.0000 mL | ORAL_SOLUTION | Freq: Three times a day (TID) | ORAL | 0 refills | Status: DC | PRN

## 2018-06-30 MED ORDER — CEFDINIR 300 MG PO CAPS: 300.0000 mg | ORAL_CAPSULE | Freq: Two times a day (BID) | ORAL | 0 refills | Status: AC

## 2018-06-30 NOTE — Progress Notes (Signed)
Date:  06/30/2018   Name:  Tyler Estes   DOB:  [birth year]   MRN:  [id number]   Chief Complaint: Cough (Chills, cough, body aches. Started wednesday. Yellow and white mucous. Aching in knees, elbows, and top of head. Headache- headache feels bruised. )  Cough  This is a new problem. The current episode started yesterday. The problem has been gradually worsening. The problem occurs every few minutes. The cough is productive of sputum. Associated symptoms include chills, a fever, headaches and myalgias. Pertinent negatives include no chest pain, ear pain, sore throat, shortness of breath or wheezing.    Review of Systems  Constitutional: Positive for chills, fatigue and fever.  HENT: Negative for ear pain, sore throat and trouble swallowing.   Respiratory: Positive for cough. Negative for shortness of breath and wheezing.   Cardiovascular: Negative for chest pain and palpitations.  Musculoskeletal: Positive for myalgias. Negative for arthralgias.  Neurological: Positive for headaches. Negative for dizziness and light-headedness.    Patient Active Problem List   Diagnosis Date Noted  . Benign colon polyp 01/10/2015  . Hypercholesteremia 01/10/2015  . Essential hypertension 01/10/2015  . Hx MRSA infection 01/10/2015  . History of pulmonary embolus (PE) 01/10/2015  . 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 Compulsive tobacco user syndrome 01/10/2015    Allergies  Allergen Reactions  . Aspirin     Past Surgical History:  Procedure Laterality Date  . COLONOSCOPY    . COLONOSCOPY WITH PROPOFOL N/A 01/27/2018   Procedure: COLONOSCOPY WITH PROPOFOL;  Surgeon: Christena Deem, MD;  Location: [facility] ENDOSCOPY;  Service: Endoscopy;  Laterality: N/A;  . VASCULAR SURGERY  2013   Leg  . VASECTOMY      Social History   Tobacco Use  . Smoking status: Current Every Day Smoker    Packs/day: 0.50    Types: Cigarettes  . Smokeless tobacco: Never Used  Substance Use Topics  . Alcohol use: Yes      Alcohol/week: 28.0 standard drinks    Types: 28 Cans of beer per week    Comment: 4 beers a day  . Drug use: No    Comment: Quit in 1997     Medication list has been reviewed and updated.  Current Meds  Medication Sig  . lisinopril-hydrochlorothiazide (PRINZIDE,ZESTORETIC) 20-25 MG tablet Take 1 tablet by mouth daily.  . polyethylene glycol-electrolytes (NULYTELY/GOLYTELY) 420 g solution Take 4,000 mLs by mouth once.  . predniSONE (DELTASONE) 50 MG tablet 1 tablet daily x 5 days  . rosuvastatin (CRESTOR) 20 MG tablet Take 1 tablet (20 mg total) by mouth daily.  Marland Kitchen warfarin (COUMADIN) 7.5 MG tablet Take 1 tablet (7.5 mg total) by mouth daily. And take an extra 1/2 tablet once a week    PHQ 2/9 Scores 04/10/2018 04/06/2017 03/02/2016 09/30/2015  PHQ - 2 Score 0 0 0 0    Physical Exam  HENT:  Right Ear: Ear canal normal. Tympanic membrane is erythematous.  Left Ear: Ear canal normal. Tympanic membrane is erythematous.  Nose: Right sinus exhibits no maxillary sinus tenderness. Left sinus exhibits no maxillary sinus tenderness.  Mouth/Throat: Posterior oropharyngeal erythema present. No oropharyngeal exudate or posterior oropharyngeal edema.  Neck: Normal range of motion. Neck supple.  Cardiovascular: Normal rate, regular rhythm and normal heart sounds.  Pulmonary/Chest: Effort normal and breath sounds normal. He has no wheezes. He has no rales.  Lymphadenopathy:    He has no cervical adenopathy.    BP 112/68 (BP Location: Right Arm, Patient Position:  Sitting, Cuff Size: Normal)   Pulse (!) 108   Temp 99 F (37.2 C) (Oral)   Ht 6' (1.829 m)   Wt 198 lb 12.8 oz (90.2 kg)   SpO2 96%   BMI 26.96 kg/m   Assessment and Plan: 1. Upper respiratory tract infection, unspecified type Influenza test negative - guaiFENesin-codeine (ROBITUSSIN AC) 100-10 MG/5ML syrup; Take 5 mLs by mouth 3 (three) times daily as needed for cough.  Dispense: 118 mL; Refill: 0  2. Acute otitis media,  unspecified otitis media type - cefdinir (OMNICEF) 300 MG capsule; Take 1 capsule (300 mg total) by mouth 2 (two) times daily for 10 days.  Dispense: 20 capsule; Refill: 0   Partially dictated using Animal nutritionistDragon software. Any errors are unintentional.  Bari EdwardLaura Carisa Backhaus, MD [facility]  06/30/2018

## 2018-07-04 ENCOUNTER — Telehealth: Payer: Self-pay | Admitting: *Deleted

## 2018-07-04 DIAGNOSIS — Z87891 Personal history of nicotine dependence: Secondary | ICD-10-CM

## 2018-07-04 DIAGNOSIS — Z122 Encounter for screening for malignant neoplasm of respiratory organs: Secondary | ICD-10-CM

## 2018-07-04 NOTE — Telephone Encounter (Signed)
Received referral for initial lung cancer screening scan. Contacted patient and obtained smoking history,(current, 45 pack year) as well as answering questions related to screening process. Patient denies signs of lung cancer such as weight loss or hemoptysis. Patient denies comorbidity that would prevent curative treatment if lung cancer were found. Patient is scheduled for shared decision making visit and CT scan on 07/27/18.

## 2018-07-27 ENCOUNTER — Ambulatory Visit: Payer: Managed Care, Other (non HMO)

## 2018-07-27 ENCOUNTER — Ambulatory Visit: Payer: Managed Care, Other (non HMO) | Admitting: Nurse Practitioner

## 2018-08-10 ENCOUNTER — Telehealth: Payer: Self-pay | Admitting: *Deleted

## 2018-08-10 DIAGNOSIS — Z87891 Personal history of nicotine dependence: Secondary | ICD-10-CM

## 2018-08-10 DIAGNOSIS — Z122 Encounter for screening for malignant neoplasm of respiratory organs: Secondary | ICD-10-CM

## 2018-08-10 NOTE — Telephone Encounter (Signed)
Received referral for initial lung cancer screening scan. Contacted patient and obtained smoking history,(current, 45 pack year) as well as answering questions related to screening process. Patient denies signs of lung cancer such as weight loss or hemoptysis. Patient denies comorbidity that would prevent curative treatment if lung cancer were found. Patient is scheduled for shared decision making visit and CT scan on 10/01/18 at 130pm.

## 2018-08-22 DIAGNOSIS — H903 Sensorineural hearing loss, bilateral: Secondary | ICD-10-CM | POA: Diagnosis not present

## 2018-08-22 DIAGNOSIS — H90A22 Sensorineural hearing loss, unilateral, left ear, with restricted hearing on the contralateral side: Secondary | ICD-10-CM | POA: Diagnosis not present

## 2018-08-30 ENCOUNTER — Telehealth: Payer: Self-pay | Admitting: *Deleted

## 2018-08-30 NOTE — Telephone Encounter (Signed)
Called pt to remind him of his ldct screening on 08-31-2018@1330 , voiced understanding.

## 2018-08-31 ENCOUNTER — Encounter: Payer: Self-pay | Admitting: *Deleted

## 2018-08-31 ENCOUNTER — Inpatient Hospital Stay: Payer: 59 | Attending: Nurse Practitioner | Admitting: Nurse Practitioner

## 2018-08-31 ENCOUNTER — Ambulatory Visit
Admission: RE | Admit: 2018-08-31 | Discharge: 2018-08-31 | Disposition: A | Discharge status: Home / Self Care | Payer: 59 | Source: Ambulatory Visit | Attending: Nurse Practitioner | Admitting: Nurse Practitioner

## 2018-08-31 ENCOUNTER — Encounter: Payer: Self-pay | Admitting: Nurse Practitioner

## 2018-08-31 DIAGNOSIS — Z122 Encounter for screening for malignant neoplasm of respiratory organs: Secondary | ICD-10-CM | POA: Insufficient documentation

## 2018-08-31 DIAGNOSIS — Z87891 Personal history of nicotine dependence: Secondary | ICD-10-CM | POA: Diagnosis present

## 2018-08-31 DIAGNOSIS — F17210 Nicotine dependence, cigarettes, uncomplicated: Secondary | ICD-10-CM | POA: Diagnosis not present

## 2018-08-31 NOTE — Progress Notes (Signed)
In accordance with CMS guidelines, patient has met eligibility criteria including age, absence of signs or symptoms of lung cancer.  Social History   Tobacco Use  . Smoking status: Current Every Day Smoker    Packs/day: 1.00    Years: 45.00    Pack years: 45.00    Types: Cigarettes  . Smokeless tobacco: Never Used  . Tobacco comment: currently .75ppd  Substance Use Topics  . Alcohol use: Yes    Alcohol/week: 28.0 standard drinks    Types: 28 Cans of beer per week    Comment: 4 beers a day  . Drug use: No    Comment: Quit in 1997      A shared decision-making session was conducted prior to the performance of CT scan. This includes one or more decision aids, includes benefits and harms of screening, follow-up diagnostic testing, over-diagnosis, false positive rate, and total radiation exposure.   Counseling on the importance of adherence to annual lung cancer LDCT screening, impact of co-morbidities, and ability or willingness to undergo diagnosis and treatment is imperative for compliance of the program.   Counseling on the importance of continued smoking cessation for former smokers; the importance of smoking cessation for current smokers, and information about tobacco cessation interventions have been given to patient including [place] [facility]uit [facility] and 1800 quit [place] programs.   Written order for lung cancer screening with LDCT has been given to the patient and any and all questions have been answered to the best of my abilities.    Yearly follow up will be coordinated by Shawn Perkins, Thoracic Navigator.  Lauren Allen, DNP, AGNP-C [facility] [phone] (work cell) [phone] (office) 08/31/18 1:55 PM   

## 2018-10-12 ENCOUNTER — Ambulatory Visit (INDEPENDENT_AMBULATORY_CARE_PROVIDER_SITE_OTHER): Payer: 59 | Admitting: Internal Medicine

## 2018-10-12 ENCOUNTER — Other Ambulatory Visit: Payer: Self-pay

## 2018-10-12 ENCOUNTER — Encounter: Payer: Self-pay | Admitting: Internal Medicine

## 2018-10-12 VITALS — BP 124/80 | HR 80 | Ht 72.0 in | Wt 206.0 lb

## 2018-10-12 DIAGNOSIS — Z7901 Long term (current) use of anticoagulants: Secondary | ICD-10-CM | POA: Diagnosis not present

## 2018-10-12 DIAGNOSIS — F17200 Nicotine dependence, unspecified, uncomplicated: Secondary | ICD-10-CM

## 2018-10-12 DIAGNOSIS — Z0000 Encounter for general adult medical examination without abnormal findings: Secondary | ICD-10-CM | POA: Diagnosis not present

## 2018-10-12 DIAGNOSIS — I10 Essential (primary) hypertension: Secondary | ICD-10-CM

## 2018-10-12 DIAGNOSIS — Z125 Encounter for screening for malignant neoplasm of prostate: Secondary | ICD-10-CM

## 2018-10-12 DIAGNOSIS — E7800 Pure hypercholesterolemia, unspecified: Secondary | ICD-10-CM

## 2018-10-12 DIAGNOSIS — I700 Atherosclerosis of aorta: Secondary | ICD-10-CM | POA: Insufficient documentation

## 2018-10-12 DIAGNOSIS — I2510 Atherosclerotic heart disease of native coronary artery without angina pectoris: Secondary | ICD-10-CM | POA: Diagnosis not present

## 2018-10-12 DIAGNOSIS — Z86711 Personal history of pulmonary embolism: Secondary | ICD-10-CM

## 2018-10-12 LAB — POCT URINALYSIS DIPSTICK
Blood, UA: NEGATIVE
Glucose, UA: NEGATIVE
Ketones, UA: NEGATIVE
Leukocytes, UA: NEGATIVE
NITRITE UA: NEGATIVE
PROTEIN UA: NEGATIVE
Spec Grav, UA: 1.015 (ref 1.010–1.025)
Urobilinogen, UA: 0.2 E.U./dL
pH, UA: 6 (ref 5.0–8.0)

## 2018-10-12 MED ORDER — VARENICLINE TARTRATE 1 MG PO TABS: 1.0000 mg | ORAL_TABLET | Freq: Two times a day (BID) | ORAL | 4 refills | Status: DC

## 2018-10-12 MED ORDER — LISINOPRIL-HYDROCHLOROTHIAZIDE 20-25 MG PO TABS
1.0000 | ORAL_TABLET | Freq: Every day | ORAL | 1 refills | Status: DC
Start: 2018-10-12 — End: 2019-04-16

## 2018-10-12 MED ORDER — WARFARIN SODIUM 7.5 MG PO TABS: 7.5000 mg | ORAL_TABLET | Freq: Every day | ORAL | 1 refills | Status: DC

## 2018-10-12 MED ORDER — VARENICLINE TARTRATE 0.5 MG X 11 & 1 MG X 42 PO MISC: ORAL | 0 refills | Status: DC

## 2018-10-12 MED ORDER — ROSUVASTATIN CALCIUM 20 MG PO TABS: 20.0000 mg | ORAL_TABLET | Freq: Every day | ORAL | 1 refills | Status: DC

## 2018-10-12 NOTE — Progress Notes (Signed)
Date:  10/12/2018   Name:  Tyler Estes   DOB:  1956/04/13   MRN:  [id number]   Chief Complaint: Annual Exam Tyler Estes is a 63 y.o. male who presents today for his Complete Annual Exam. He feels well. He reports exercising some - still working. He reports he is sleeping fairly well. He continue to consume 4 beers per day.  He has had no legal issues with alcohol and no history of withdrawal when he does not drink. He continue to smoke.  He had LDCT which showed no malignancy but extensive atherosclerosis of aorta and coronary arteries.   Hypertension  This is a chronic problem. The problem is controlled. Pertinent negatives include no chest pain, headaches, palpitations or shortness of breath. Past treatments include ACE inhibitors and diuretics. The current treatment provides significant improvement.  Hyperlipidemia  This is a chronic problem. The problem is controlled. Pertinent negatives include no chest pain, myalgias or shortness of breath. Current antihyperlipidemic treatment includes statins. The current treatment provides significant improvement of lipids. Risk factors for coronary artery disease include dyslipidemia, male sex and hypertension.  Nicotine Dependence  Presents for initial visit. Symptoms are negative for fatigue. Preferred tobacco types include cigarettes. His urge triggers include company of smokers. Past treatments include nothing. Tyler Estes is thinking about quitting. His past medical history is significant for alcohol abuse.  Hx of PE - on chronic warfarin.  He denies bleeding issues.  No recurrence of lung sx.  Review of Systems  Constitutional: Negative for appetite change, chills, diaphoresis, fatigue and unexpected weight change.  HENT: Negative for hearing loss, tinnitus, trouble swallowing and voice change.   Eyes: Negative for visual disturbance.  Respiratory: Negative for choking, shortness of breath and wheezing.   Cardiovascular: Negative for chest  pain, palpitations and leg swelling.  Gastrointestinal: Negative for abdominal pain, blood in stool, constipation and diarrhea.  Genitourinary: Positive for frequency (nocturia x 2). Negative for difficulty urinating and dysuria.  Musculoskeletal: Negative for arthralgias, back pain and myalgias.  Skin: Negative for color change and rash.  Allergic/Immunologic: Negative for environmental allergies.  Neurological: Negative for dizziness, syncope and headaches.  Hematological: Negative for adenopathy.  Psychiatric/Behavioral: Negative for dysphoric mood and sleep disturbance. The patient is not nervous/anxious.     Patient Active Problem List   Diagnosis Date Noted  . Personal history of tobacco use, presenting hazards to health 08/31/2018  . Benign colon polyp 01/10/2015  . Hypercholesteremia 01/10/2015  . Essential hypertension 01/10/2015  . Hx MRSA infection 01/10/2015  . History of pulmonary embolus (PE) 01/10/2015  . 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 Compulsive tobacco user syndrome 01/10/2015    No Known Allergies  Past Surgical History:  Procedure Laterality Date  . COLONOSCOPY    . COLONOSCOPY WITH PROPOFOL N/A 01/27/2018   Procedure: COLONOSCOPY WITH PROPOFOL;  Surgeon: Christena Deem, MD;  Location: [facility] ENDOSCOPY;  Service: Endoscopy;  Laterality: N/A;  . VASCULAR SURGERY  2013   Leg  . VASECTOMY      Social History   Tobacco Use  . Smoking status: Current Every Day Smoker    Packs/day: 1.00    Years: 45.00    Pack years: 45.00    Types: Cigarettes  . Smokeless tobacco: Never Used  . Tobacco comment: currently .75ppd  Substance Use Topics  . Alcohol use: Yes    Alcohol/week: 28.0 standard drinks    Types: 28 Cans of beer per week    Comment: 4 beers a day  .  Drug use: No    Comment: Quit in 1997     Medication list has been reviewed and updated.  Current Meds  Medication Sig  . lisinopril-hydrochlorothiazide (PRINZIDE,ZESTORETIC) 20-25 MG tablet Take 1  tablet by mouth daily.  . rosuvastatin (CRESTOR) 20 MG tablet Take 1 tablet (20 mg total) by mouth daily.  Marland Kitchen warfarin (COUMADIN) 7.5 MG tablet Take 1 tablet (7.5 mg total) by mouth daily. And take an extra 1/2 tablet once a week  . [DISCONTINUED] polyethylene glycol-electrolytes (NULYTELY/GOLYTELY) 420 g solution Take 4,000 mLs by mouth once.    PHQ 2/9 Scores 10/12/2018 04/10/2018 04/06/2017 03/02/2016  PHQ - 2 Score 0 0 0 0    Physical Exam Vitals signs and nursing note reviewed.  Constitutional:      Appearance: Normal appearance. He is well-developed.  HENT:     Head: Normocephalic.     Right Ear: Tympanic membrane, ear canal and external ear normal.     Left Ear: Tympanic membrane, ear canal and external ear normal.     Nose: Nose normal.     Mouth/Throat:     Mouth: Mucous membranes are moist.     Pharynx: Uvula midline.  Eyes:     Conjunctiva/sclera: Conjunctivae normal.     Pupils: Pupils are equal, round, and reactive to light.  Neck:     Musculoskeletal: Normal range of motion and neck supple.     Thyroid: No thyromegaly.     Vascular: No carotid bruit.  Cardiovascular:     Rate and Rhythm: Normal rate and regular rhythm.     Heart sounds: Normal heart sounds.  Pulmonary:     Effort: Pulmonary effort is normal.     Breath sounds: Normal breath sounds. No wheezing.  Chest:     Breasts:        Right: No mass.        Left: No mass.  Abdominal:     General: Bowel sounds are normal.     Palpations: Abdomen is soft.     Tenderness: There is no abdominal tenderness.  Musculoskeletal: Normal range of motion.  Lymphadenopathy:     Cervical: No cervical adenopathy.  Skin:    General: Skin is warm and dry.  Neurological:     Mental Status: He is alert and oriented to person, place, and time.     Deep Tendon Reflexes: Reflexes are normal and symmetric.  Psychiatric:        Speech: Speech normal.        Behavior: Behavior normal.        Thought Content: Thought content  normal.        Judgment: Judgment normal.     BP 124/80   Pulse 80   Ht 6' (1.829 m)   Wt 206 lb (93.4 kg)   SpO2 94%   BMI 27.94 kg/m   Assessment and Plan: 1. Annual physical exam Normal exam - POCT urinalysis dipstick  2. Essential hypertension controlled - Comprehensive metabolic panel - CBC with Differential/Platelet - lisinopril-hydrochlorothiazide (PRINZIDE,ZESTORETIC) 20-25 MG tablet; Take 1 tablet by mouth daily.  Dispense: 90 tablet; Refill: 1  3. Hypercholesteremia Continue statin therapy - Lipid panel - rosuvastatin (CRESTOR) 20 MG tablet; Take 1 tablet (20 mg total) by mouth daily.  Dispense: 90 tablet; Refill: 1  4. Atherosclerosis of coronary artery of native heart without angina pectoris, unspecified vessel or lesion type On CT - needs cardiac testing - Ambulatory referral to Cardiology  5. Aortic atherosclerosis (HCC) -  Ambulatory referral to Cardiology  6. History of pulmonary embolus (PE) Continue warfarin - warfarin (COUMADIN) 7.5 MG tablet; Take 1 tablet (7.5 mg total) by mouth daily. And take an extra 1/2 tablet once a week  Dispense: 90 tablet; Refill: 1  7. Compulsive tobacco user syndrome Rx and coupon for chantix given - varenicline (CHANTIX STARTING MONTH PAK) 0.5 MG X 11 & 1 MG X 42 tablet; Take one 0.5 mg tablet by mouth once daily for 3 days, then increase to one 0.5 mg tablet twice daily for 4 days, then increase to one 1 mg tablet twice daily.  Dispense: 53 tablet; Refill: 0 - varenicline (CHANTIX CONTINUING MONTH PAK) 1 MG tablet; Take 1 tablet (1 mg total) by mouth 2 (two) times daily.  Dispense: 60 tablet; Refill: 4  8. Anticoagulated on warfarin - Protime-INR  9. Prostate cancer screening DRE deferred - PSA   Partially dictated using Dragon software. Any errors are unintentional.  Bari Edward, MD [facility]  10/12/2018

## 2018-10-13 LAB — COMPREHENSIVE METABOLIC PANEL
ALT: 39 IU/L (ref 0–44)
AST: 26 IU/L (ref 0–40)
Albumin/Globulin Ratio: 2.2 (ref 1.2–2.2)
Albumin: 4.6 g/dL (ref 3.8–4.8)
Alkaline Phosphatase: 52 IU/L (ref 39–117)
BUN/Creatinine Ratio: 15 (ref 10–24)
BUN: 16 mg/dL (ref 8–27)
Bilirubin Total: 0.5 mg/dL (ref 0.0–1.2)
CO2: 22 mmol/L (ref 20–29)
CREATININE: 1.04 mg/dL (ref 0.76–1.27)
Calcium: 9.3 mg/dL (ref 8.6–10.2)
Chloride: 107 mmol/L — ABNORMAL HIGH (ref 96–106)
GFR calc Af Amer: 88 mL/min/{1.73_m2} (ref >59)
GFR calc non Af Amer: 76 mL/min/{1.73_m2} (ref >59)
GLUCOSE: 102 mg/dL — AB (ref 65–99)
Globulin, Total: 2.1 g/dL (ref 1.5–4.5)
Potassium: 4.2 mmol/L (ref 3.5–5.2)
Sodium: 143 mmol/L (ref 134–144)
Total Protein: 6.7 g/dL (ref 6.0–8.5)

## 2018-10-13 LAB — CBC WITH DIFFERENTIAL/PLATELET
BASOS ABS: 0 10*3/uL (ref 0.0–0.2)
Basos: 1 %
EOS (ABSOLUTE): 0.1 10*3/uL (ref 0.0–0.4)
EOS: 1 %
Hematocrit: 46.5 % (ref 37.5–51.0)
Hemoglobin: 17.2 g/dL (ref 13.0–17.7)
IMMATURE GRANS (ABS): 0 10*3/uL (ref 0.0–0.1)
IMMATURE GRANULOCYTES: 0 %
LYMPHS ABS: 1.6 10*3/uL (ref 0.7–3.1)
LYMPHS: 33 %
MCH: 34.5 pg — AB (ref 26.6–33.0)
MCHC: 37 g/dL — ABNORMAL HIGH (ref 31.5–35.7)
MCV: 93 fL (ref 79–97)
MONOCYTES: 9 %
Monocytes Absolute: 0.4 10*3/uL (ref 0.1–0.9)
NEUTROS PCT: 56 %
Neutrophils Absolute: 2.7 10*3/uL (ref 1.4–7.0)
Platelets: 175 10*3/uL (ref 150–450)
RBC: 4.98 x10E6/uL (ref 4.14–5.80)
RDW: 13.6 % (ref 11.6–15.4)
WBC: 4.8 10*3/uL (ref 3.4–10.8)

## 2018-10-13 LAB — LIPID PANEL
Chol/HDL Ratio: 3.6 ratio (ref 0.0–5.0)
Cholesterol, Total: 201 mg/dL — ABNORMAL HIGH (ref 100–199)
HDL: 56 mg/dL (ref >39)
LDL CALC: 109 mg/dL — AB (ref 0–99)
TRIGLYCERIDES: 180 mg/dL — AB (ref 0–149)
VLDL CHOLESTEROL CAL: 36 mg/dL (ref 5–40)

## 2018-10-13 LAB — PROTIME-INR
INR: 3.1 — ABNORMAL HIGH (ref 0.8–1.2)
Prothrombin Time: 29.1 s — ABNORMAL HIGH (ref 9.1–12.0)

## 2018-10-13 LAB — PSA: Prostate Specific Ag, Serum: 0.8 ng/mL (ref 0.0–4.0)

## 2019-04-16 ENCOUNTER — Telehealth: Payer: Self-pay | Admitting: Internal Medicine

## 2019-04-16 ENCOUNTER — Other Ambulatory Visit: Payer: Self-pay

## 2019-04-16 DIAGNOSIS — Z86711 Personal history of pulmonary embolism: Secondary | ICD-10-CM

## 2019-04-16 DIAGNOSIS — I10 Essential (primary) hypertension: Secondary | ICD-10-CM

## 2019-04-16 DIAGNOSIS — E7800 Pure hypercholesterolemia, unspecified: Secondary | ICD-10-CM

## 2019-04-16 MED ORDER — WARFARIN SODIUM 7.5 MG PO TABS: 7.5000 mg | ORAL_TABLET | Freq: Every day | ORAL | 1 refills | Status: DC

## 2019-04-16 MED ORDER — ROSUVASTATIN CALCIUM 20 MG PO TABS: 20.0000 mg | ORAL_TABLET | Freq: Every day | ORAL | 1 refills | Status: DC

## 2019-04-16 MED ORDER — LISINOPRIL-HYDROCHLOROTHIAZIDE 20-25 MG PO TABS: 1.0000 | ORAL_TABLET | Freq: Every day | ORAL | 1 refills | Status: DC

## 2019-04-16 NOTE — Telephone Encounter (Signed)
Pt needs refills sent in to [facility] in [place], is out of one and the other will be running out soon, does not have any upcoming appts  rosuvastatin (CRESTOR) 20 MG tablet [[id number]]

## 2019-04-16 NOTE — Telephone Encounter (Signed)
Sent in patient medications to [place]. We need to see him in the next 30 days for a blood pressure follow up with labs.  Thankyou !

## 2019-05-10 ENCOUNTER — Ambulatory Visit: Payer: Self-pay | Admitting: Internal Medicine

## 2019-05-21 ENCOUNTER — Encounter: Payer: Self-pay | Admitting: Internal Medicine

## 2019-05-21 ENCOUNTER — Ambulatory Visit (INDEPENDENT_AMBULATORY_CARE_PROVIDER_SITE_OTHER): Payer: BLUE CROSS/BLUE SHIELD | Admitting: Internal Medicine

## 2019-05-21 ENCOUNTER — Other Ambulatory Visit: Payer: Self-pay

## 2019-05-21 VITALS — BP 136/86 | HR 76 | Ht 72.0 in | Wt 203.0 lb

## 2019-05-21 DIAGNOSIS — E7800 Pure hypercholesterolemia, unspecified: Secondary | ICD-10-CM | POA: Diagnosis not present

## 2019-05-21 DIAGNOSIS — I10 Essential (primary) hypertension: Secondary | ICD-10-CM | POA: Diagnosis not present

## 2019-05-21 DIAGNOSIS — Z7901 Long term (current) use of anticoagulants: Secondary | ICD-10-CM

## 2019-05-21 NOTE — Progress Notes (Signed)
Date:  05/21/2019   Name:  Tyler Estes   DOB:  October 08, 1955   MRN:  [id number]  Since last visit he was furloughed then laid off.  He has decided to apply for SS now.  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  Chief Complaint: Hypertension, Hyperlipidemia, and Coagulation Disorder  Hypertension This is a chronic problem. The problem is controlled. Pertinent negatives include no chest pain, headaches, palpitations or shortness of breath. Past treatments include ACE inhibitors and diuretics.  Hyperlipidemia This is a chronic problem. The problem is controlled. Pertinent negatives include no chest pain or shortness of breath. Current antihyperlipidemic treatment includes statins. The current treatment provides significant improvement of lipids.  hx on PE on chronic warfarin - doing well with no bleeding issues.  Last INR in February.  Lab Results  Component Value Date   CHOL 201 (H) 10/12/2018   HDL 56 10/12/2018   LDLCALC 109 (H) 10/12/2018   TRIG 180 (H) 10/12/2018   CHOLHDL 3.6 10/12/2018   Lab Results  Component Value Date   INR 3.1 (H) 10/12/2018   INR 1.07 01/27/2018   INR 2.6 (H) 10/12/2017   PROTIME 24.5 (A) 12/18/2014     Review of Systems  Constitutional: Negative for appetite change, fatigue and unexpected weight change.  Eyes: Negative for visual disturbance.  Respiratory: Negative for cough, shortness of breath and wheezing.   Cardiovascular: Negative for chest pain, palpitations and leg swelling.  Gastrointestinal: Negative for abdominal pain and blood in stool.  Endocrine: Negative for polydipsia and polyuria.  Genitourinary: Negative for dysuria and hematuria.  Skin: Negative for color change and rash.  Neurological: Negative for tremors, numbness and headaches.  Psychiatric/Behavioral: Negative for dysphoric mood.    Patient Active Problem List   Diagnosis Date Noted  . Aortic atherosclerosis (HCC) 10/12/2018  . Anticoagulated on warfarin  10/12/2018  . Personal history of tobacco use, presenting hazards to health 08/31/2018  . Benign colon polyp 01/10/2015  . Hypercholesteremia 01/10/2015  . Essential hypertension 01/10/2015  . Hx MRSA infection 01/10/2015  . History of pulmonary embolus (PE) 01/10/2015  . 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 Compulsive tobacco user syndrome 01/10/2015    No Known Allergies  Past Surgical History:  Procedure Laterality Date  . COLONOSCOPY    . COLONOSCOPY WITH PROPOFOL N/A 01/27/2018   Procedure: COLONOSCOPY WITH PROPOFOL;  Surgeon: Christena Deem, MD;  Location: [facility] ENDOSCOPY;  Service: Endoscopy;  Laterality: N/A;  . VASCULAR SURGERY  2013   Leg  . VASECTOMY      Social History   Tobacco Use  . Smoking status: Current Every Day Smoker    Packs/day: 1.00    Years: 15.00    Pack years: 15.00    Types: Cigarettes  . Smokeless tobacco: Never Used  . Tobacco comment: currently .75ppd  Substance Use Topics  . Alcohol use: Yes    Alcohol/week: 28.0 standard drinks    Types: 28 Cans of beer per week    Comment: 4 beers a day  . Drug use: No    Comment: Quit in 1997     Medication list has been reviewed and updated.  Current Meds  Medication Sig  . lisinopril-hydrochlorothiazide (ZESTORETIC) 20-25 MG tablet Take 1 tablet by mouth daily.  . rosuvastatin (CRESTOR) 20 MG tablet Take 1 tablet (20 mg total) by mouth daily.  Marland Kitchen warfarin (COUMADIN) 7.5 MG tablet Take 1 tablet (7.5 mg total) by mouth daily. And take an  extra 1/2 tablet once a week    PHQ 2/9 Scores 05/21/2019 10/12/2018 04/10/2018 04/06/2017  PHQ - 2 Score 0 0 0 0    BP Readings from Last 3 Encounters:  05/21/19 (!) 148/92  10/12/18 124/80  06/30/18 112/68    Physical Exam Vitals signs and nursing note reviewed.  Constitutional:      General: He is not in acute distress.    Appearance: Normal appearance. He is well-developed.  HENT:     Head: Normocephalic and atraumatic.  Neck:     Musculoskeletal: Normal  range of motion and neck supple.     Vascular: No carotid bruit.  Cardiovascular:     Rate and Rhythm: Normal rate and regular rhythm.     Pulses: Normal pulses.     Heart sounds: No murmur.  Pulmonary:     Effort: Pulmonary effort is normal. No respiratory distress.  Musculoskeletal: Normal range of motion.     Right lower leg: No edema.     Left lower leg: No edema.  Lymphadenopathy:     Cervical: No cervical adenopathy.  Skin:    General: Skin is warm and dry.     Capillary Refill: Capillary refill takes less than 2 seconds.     Findings: No rash.  Neurological:     General: No focal deficit present.     Mental Status: He is alert and oriented to person, place, and time.  Psychiatric:        Behavior: Behavior normal.        Thought Content: Thought content normal.     Wt Readings from Last 3 Encounters:  05/21/19 203 lb (92.1 kg)  10/12/18 206 lb (93.4 kg)  08/31/18 202 lb (91.6 kg)    BP (!) 148/92   Pulse 76   Ht 6' (1.829 m)   Wt 203 lb (92.1 kg)   SpO2 96%   BMI 27.53 kg/m   Assessment and Plan: 1. Essential hypertension Clinically stable exam with well controlled BP.   Tolerating medications, lisinopril hct 20/25 daily, without side effects at this time. Pt to continue current regimen and low sodium diet; benefits of regular exercise as able discussed.   2. Anticoagulated on warfarin Doing well with no complications, bleeding or recurrence of PE Mild baseline SOB due to smoking - Protime-INR  3. Hypercholesteremia Tolerating statin medication without side effects at this time LDL is at goal of < 130 on current dose of moderate intensity statin Continue same therapy without change at this time.   Partially dictated using Editor, commissioning. Any errors are unintentional.  Halina Maidens, MD [facility]  05/21/2019

## 2019-05-22 LAB — PROTIME-INR
INR: 3.9 — ABNORMAL HIGH (ref 0.9–1.2)
Prothrombin Time: 39 s — ABNORMAL HIGH (ref 9.1–12.0)

## 2019-08-07 ENCOUNTER — Ambulatory Visit: Payer: BLUE CROSS/BLUE SHIELD | Attending: Internal Medicine

## 2019-08-07 DIAGNOSIS — Z20822 Contact with and (suspected) exposure to covid-19: Secondary | ICD-10-CM

## 2019-08-09 LAB — NOVEL CORONAVIRUS, NAA: SARS-CoV-2, NAA: NOT DETECTED

## 2019-08-29 ENCOUNTER — Telehealth: Payer: Self-pay

## 2019-08-29 NOTE — Telephone Encounter (Signed)
Left message for patient to notify them that it is time to schedule annual low dose lung cancer screening CT scan. Instructed patient to call back ([phone]) to verify information prior to the scan being scheduled.  

## 2019-09-21 ENCOUNTER — Encounter: Payer: Self-pay | Admitting: *Deleted

## 2019-09-21 ENCOUNTER — Telehealth: Payer: Self-pay | Admitting: *Deleted

## 2019-09-21 NOTE — Telephone Encounter (Signed)
Left message for patient to notify them that it is time to schedule annual low dose lung cancer screening CT scan. Instructed patient to call back to verify information prior to the scan being scheduled.  

## 2019-10-09 ENCOUNTER — Other Ambulatory Visit: Payer: Self-pay

## 2019-10-09 DIAGNOSIS — E7800 Pure hypercholesterolemia, unspecified: Secondary | ICD-10-CM

## 2019-10-09 DIAGNOSIS — I10 Essential (primary) hypertension: Secondary | ICD-10-CM

## 2019-10-09 DIAGNOSIS — Z86711 Personal history of pulmonary embolism: Secondary | ICD-10-CM

## 2019-10-09 MED ORDER — LISINOPRIL-HYDROCHLOROTHIAZIDE 20-25 MG PO TABS: 1.0000 | ORAL_TABLET | Freq: Every day | ORAL | 1 refills | Status: DC

## 2019-10-09 MED ORDER — ROSUVASTATIN CALCIUM 20 MG PO TABS: 20.0000 mg | ORAL_TABLET | Freq: Every day | ORAL | 1 refills | Status: DC

## 2019-10-17 ENCOUNTER — Other Ambulatory Visit: Payer: Self-pay

## 2019-10-17 ENCOUNTER — Ambulatory Visit (INDEPENDENT_AMBULATORY_CARE_PROVIDER_SITE_OTHER): Payer: BLUE CROSS/BLUE SHIELD | Admitting: Internal Medicine

## 2019-10-17 ENCOUNTER — Encounter: Payer: Self-pay | Admitting: Internal Medicine

## 2019-10-17 VITALS — BP 140/82 | HR 82 | Temp 97.1°F | Ht 72.0 in | Wt 204.0 lb

## 2019-10-17 DIAGNOSIS — Z7901 Long term (current) use of anticoagulants: Secondary | ICD-10-CM

## 2019-10-17 DIAGNOSIS — I700 Atherosclerosis of aorta: Secondary | ICD-10-CM

## 2019-10-17 DIAGNOSIS — Z87891 Personal history of nicotine dependence: Secondary | ICD-10-CM

## 2019-10-17 DIAGNOSIS — I10 Essential (primary) hypertension: Secondary | ICD-10-CM | POA: Diagnosis not present

## 2019-10-17 DIAGNOSIS — N5089 Other specified disorders of the male genital organs: Secondary | ICD-10-CM

## 2019-10-17 DIAGNOSIS — E7800 Pure hypercholesterolemia, unspecified: Secondary | ICD-10-CM | POA: Diagnosis not present

## 2019-10-17 DIAGNOSIS — Z86711 Personal history of pulmonary embolism: Secondary | ICD-10-CM

## 2019-10-17 DIAGNOSIS — Z125 Encounter for screening for malignant neoplasm of prostate: Secondary | ICD-10-CM

## 2019-10-17 MED ORDER — LISINOPRIL-HYDROCHLOROTHIAZIDE 20-25 MG PO TABS: 1.0000 | ORAL_TABLET | Freq: Every day | ORAL | 1 refills | Status: DC

## 2019-10-17 NOTE — Patient Instructions (Signed)
If you need ER - go to [facility] on [address].

## 2019-10-17 NOTE — Progress Notes (Signed)
Date:  10/17/2019   Name:  Tyler Estes   DOB:  [birth year]   MRN:  [id number]   Chief Complaint: Hypertension (Follow up.) and Anticoagulated on Warfarin  Colonoscopy  01/2018 Immunization History  Administered Date(s) Administered  . Pneumococcal Polysaccharide-23 03/02/2016  . Tdap 01/28/2014    Hypertension This is a chronic problem. The problem is controlled. Pertinent negatives include no chest pain, headaches, palpitations or shortness of breath. Past treatments include ACE inhibitors and diuretics. The current treatment provides significant improvement. There is no history of kidney disease or CAD/MI.  Hyperlipidemia The problem is controlled. Pertinent negatives include no chest pain or shortness of breath. Current antihyperlipidemic treatment includes statins. The current treatment provides significant improvement of lipids.  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  No extremity swelling or shortness of breath. No bleeding issues noted  Tobacco use - pt has had LDCT screening last year.  Contacted again this year but not yet scheduled.  He may not be able to get it done here since his insurance change.  Testicular swelling - started last week and has gotten larger.  On the left side, minimal discomfort in the left groin only.  No hx of trauma but he did move the septic tank cover several times.  No change in urination, no bleeding, no dyuria.  Lab Results  Component Value Date   CREATININE 1.04 10/12/2018   BUN 16 10/12/2018   NA 143 10/12/2018   K 4.2 10/12/2018   CL 107 (H) 10/12/2018   CO2 22 10/12/2018   Lab Results  Component Value Date   CHOL 201 (H) 10/12/2018   HDL 56 10/12/2018   LDLCALC 109 (H) 10/12/2018   TRIG 180 (H) 10/12/2018   CHOLHDL 3.6 10/12/2018   Lab Results  Component Value Date   TSH 1.40 09/25/2013   No results found for: HGBA1C   Review of Systems  Constitutional: Negative for chills, fatigue and fever.  Respiratory:  Negative for cough, chest tightness and shortness of breath.   Cardiovascular: Negative for chest pain, palpitations and leg swelling.  Gastrointestinal: Negative for abdominal pain, constipation and diarrhea.  Genitourinary: Positive for scrotal swelling. Negative for hematuria.  Neurological: Negative for dizziness and headaches.  Psychiatric/Behavioral: Negative for dysphoric mood and sleep disturbance.    Patient Active Problem List   Diagnosis Date Noted  . Aortic atherosclerosis ([place]) 10/12/2018  . Anticoagulated on warfarin 10/12/2018  . Personal history of tobacco use, presenting hazards to health 08/31/2018  . Benign colon polyp 01/10/2015  . Hypercholesteremia 01/10/2015  . Essential hypertension 01/10/2015  . Hx MRSA infection 01/10/2015  . History of pulmonary embolus (PE) 01/10/2015  . 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 Compulsive tobacco user syndrome 01/10/2015    No Known Allergies  Past Surgical History:  Procedure Laterality Date  . COLONOSCOPY    . COLONOSCOPY WITH PROPOFOL N/A 01/27/2018   Procedure: COLONOSCOPY WITH PROPOFOL;  Surgeon: Lollie Sails, MD;  Location: [facility] ENDOSCOPY;  Service: Endoscopy;  Laterality: N/A;  . VASCULAR SURGERY  2013   Leg  . VASECTOMY      Social History   Tobacco Use  . Smoking status: Current Every Day Smoker    Packs/day: 1.00    Years: 45.00    Pack years: 45.00    Types: Cigarettes  . Smokeless tobacco: Never Used  . Tobacco comment: currently .75ppd  Substance Use Topics  . Alcohol use: Yes    Alcohol/week: 28.0 standard drinks  Types: 28 Cans of beer per week    Comment: 4 beers a day  . Drug use: No    Comment: Quit in 1997     Medication list has been reviewed and updated.  Current Meds  Medication Sig  . lisinopril-hydrochlorothiazide (ZESTORETIC) 20-25 MG tablet Take 1 tablet by mouth daily.  . rosuvastatin (CRESTOR) 20 MG tablet Take 1 tablet (20 mg total) by mouth daily.  Marland Kitchen warfarin (COUMADIN) 7.5  MG tablet Take 1 tablet (7.5 mg total) by mouth daily. And take an extra 1/2 tablet once a week    PHQ 2/9 Scores 10/17/2019 05/21/2019 10/12/2018 04/10/2018  PHQ - 2 Score 0 0 0 0  PHQ- 9 Score 0 - - -    BP Readings from Last 3 Encounters:  10/17/19 140/82  05/21/19 136/86  10/12/18 124/80    Physical Exam Vitals and nursing note reviewed.  Constitutional:      General: He is not in acute distress.    Appearance: He is well-developed.  HENT:     Head: Normocephalic and atraumatic.  Neck:     Vascular: No carotid bruit.  Cardiovascular:     Rate and Rhythm: Normal rate and regular rhythm.     Pulses: Normal pulses.     Heart sounds: No murmur.  Pulmonary:     Effort: Pulmonary effort is normal. No respiratory distress.     Breath sounds: No wheezing or rhonchi.  Abdominal:     General: Abdomen is flat. There is no distension.     Palpations: Abdomen is soft.     Tenderness: There is no abdominal tenderness.     Hernia: No hernia is present.  Genitourinary:    Penis: Normal and circumcised.      Testes:        Right: Mass, tenderness or swelling not present.        Left: Mass (enlarged soft scrotal sac - unable to appreciate the testicular contours or size) present. Tenderness not present.  Musculoskeletal:        General: Normal range of motion.     Cervical back: Normal range of motion.  Lymphadenopathy:     Cervical: No cervical adenopathy.  Skin:    General: Skin is warm and dry.     Findings: No rash.  Neurological:     Mental Status: He is alert and oriented to person, place, and time.  Psychiatric:        Behavior: Behavior normal.        Thought Content: Thought content normal.     Wt Readings from Last 3 Encounters:  10/17/19 204 lb (92.5 kg)  05/21/19 203 lb (92.1 kg)  10/12/18 206 lb (93.4 kg)    BP 140/82   Pulse 82   Temp (!) 97.1 F (36.2 C) (Temporal)   Ht 6' (1.829 m)   Wt 204 lb (92.5 kg)   SpO2 96%   BMI 27.67 kg/m   Assessment and  Plan: 1. Essential hypertension Clinically stable exam with well controlled BP on lisinopril and hctz. Tolerating medications without side effects at this time. Pt to continue current regimen and low sodium diet; benefits of regular exercise as able discussed. - CBC with Differential/Platelet - lisinopril-hydrochlorothiazide (ZESTORETIC) 20-25 MG tablet; Take 1 tablet by mouth daily.  Dispense: 90 tablet; Refill: 1  2. Hypercholesteremia Continue statin therapy - Comprehensive metabolic panel - Lipid panel  3. Testicular mass Needs to see Urology asap - Ambulatory referral to Urology  4. History of pulmonary embolus (PE) Continue warfarin  5. Personal history of tobacco use, presenting hazards to health Due for yearly LDCT but will need insurance change  6. Prostate cancer screening DRE deferred - PSA  7. Anticoagulated on warfarin No bleeding issues although the scrotal swelling may be hemorrhagic - Protime-INR  8. Aortic atherosclerosis (HCC) On statin therapy Aspirin not indicated since he is on warfarin   Partially dictated using Animal nutritionist. Any errors are unintentional.  Bari Edward, MD [facility]  10/17/2019

## 2019-10-18 LAB — PROTIME-INR
INR: 2.8 — ABNORMAL HIGH (ref 0.9–1.2)
Prothrombin Time: 28.8 s — ABNORMAL HIGH (ref 9.1–12.0)

## 2019-10-18 LAB — CBC WITH DIFFERENTIAL/PLATELET
Basophils Absolute: 0 10*3/uL (ref 0.0–0.2)
Basos: 1 %
EOS (ABSOLUTE): 0.1 10*3/uL (ref 0.0–0.4)
Eos: 2 %
Hematocrit: 48.2 % (ref 37.5–51.0)
Hemoglobin: 17.2 g/dL (ref 13.0–17.7)
Immature Grans (Abs): 0 10*3/uL (ref 0.0–0.1)
Immature Granulocytes: 0 %
Lymphocytes Absolute: 1.3 10*3/uL (ref 0.7–3.1)
Lymphs: 32 %
MCH: 34.5 pg — ABNORMAL HIGH (ref 26.6–33.0)
MCHC: 35.7 g/dL (ref 31.5–35.7)
MCV: 97 fL (ref 79–97)
Monocytes Absolute: 0.4 10*3/uL (ref 0.1–0.9)
Monocytes: 10 %
Neutrophils Absolute: 2.2 10*3/uL (ref 1.4–7.0)
Neutrophils: 55 %
Platelets: 148 10*3/uL — ABNORMAL LOW (ref 150–450)
RBC: 4.98 x10E6/uL (ref 4.14–5.80)
RDW: 13.5 % (ref 11.6–15.4)
WBC: 4 10*3/uL (ref 3.4–10.8)

## 2019-10-18 LAB — LIPID PANEL
Chol/HDL Ratio: 2.9 ratio (ref 0.0–5.0)
Cholesterol, Total: 191 mg/dL (ref 100–199)
HDL: 66 mg/dL (ref >39)
LDL Chol Calc (NIH): 101 mg/dL — ABNORMAL HIGH (ref 0–99)
Triglycerides: 138 mg/dL (ref 0–149)
VLDL Cholesterol Cal: 24 mg/dL (ref 5–40)

## 2019-10-18 LAB — COMPREHENSIVE METABOLIC PANEL
ALT: 33 IU/L (ref 0–44)
AST: 26 IU/L (ref 0–40)
Albumin/Globulin Ratio: 2.8 — ABNORMAL HIGH (ref 1.2–2.2)
Albumin: 4.8 g/dL (ref 3.8–4.8)
Alkaline Phosphatase: 56 IU/L (ref 39–117)
BUN/Creatinine Ratio: 18 (ref 10–24)
BUN: 20 mg/dL (ref 8–27)
Bilirubin Total: 0.4 mg/dL (ref 0.0–1.2)
CO2: 21 mmol/L (ref 20–29)
Calcium: 8.9 mg/dL (ref 8.6–10.2)
Chloride: 105 mmol/L (ref 96–106)
Creatinine, Ser: 1.1 mg/dL (ref 0.76–1.27)
GFR calc Af Amer: 82 mL/min/{1.73_m2} (ref >59)
GFR calc non Af Amer: 71 mL/min/{1.73_m2} (ref >59)
Globulin, Total: 1.7 g/dL (ref 1.5–4.5)
Glucose: 102 mg/dL — ABNORMAL HIGH (ref 65–99)
Potassium: 4.2 mmol/L (ref 3.5–5.2)
Sodium: 142 mmol/L (ref 134–144)
Total Protein: 6.5 g/dL (ref 6.0–8.5)

## 2019-10-18 LAB — PSA: Prostate Specific Ag, Serum: 0.7 ng/mL (ref 0.0–4.0)

## 2019-10-23 ENCOUNTER — Telehealth: Payer: Self-pay

## 2019-10-23 ENCOUNTER — Encounter: Payer: Self-pay | Admitting: Urology

## 2019-10-23 ENCOUNTER — Ambulatory Visit (INDEPENDENT_AMBULATORY_CARE_PROVIDER_SITE_OTHER): Payer: BLUE CROSS/BLUE SHIELD | Admitting: Urology

## 2019-10-23 ENCOUNTER — Other Ambulatory Visit: Payer: Self-pay

## 2019-10-23 ENCOUNTER — Ambulatory Visit
Admission: RE | Admit: 2019-10-23 | Discharge: 2019-10-23 | Disposition: A | Discharge status: Home / Self Care | Payer: BLUE CROSS/BLUE SHIELD | Source: Ambulatory Visit | Attending: Urology | Admitting: Urology

## 2019-10-23 VITALS — BP 128/76 | HR 94 | Ht 72.0 in | Wt 205.0 lb

## 2019-10-23 DIAGNOSIS — N4340 Spermatocele of epididymis, unspecified: Secondary | ICD-10-CM

## 2019-10-23 DIAGNOSIS — N5089 Other specified disorders of the male genital organs: Secondary | ICD-10-CM | POA: Insufficient documentation

## 2019-10-23 NOTE — Progress Notes (Signed)
   10/23/19 3:28 PM   Tyler Estes [birth year] [id number]  CC: Left scrotal swelling  HPI: I saw Tyler Estes in urology clinic today for evaluation of left scrotal swelling.  An ultrasound was performed this afternoon that shows a 6 cm epididymal cyst, as well as a 8 mm simple benign-appearing intratesticular cyst of the left testicle.  He reports increasing left-sided scrotal swelling over the last month that has become mildly bothersome.  He denies any urinary complaints, gross hematuria, or weight loss.  His history is notable for vasectomy, as well as distant history of pulmonary embolism.  He does remain on Coumadin.   PMH: Past Medical History:  Diagnosis Date  . High cholesterol   . Hx pulmonary embolism   . Hypertension     Surgical History: Past Surgical History:  Procedure Laterality Date  . COLONOSCOPY    . COLONOSCOPY WITH PROPOFOL N/A 01/27/2018   Procedure: COLONOSCOPY WITH PROPOFOL;  Surgeon: Christena Deem, MD;  Location: [facility] ENDOSCOPY;  Service: Endoscopy;  Laterality: N/A;  . VASCULAR SURGERY  2013   Leg  . VASECTOMY      Family History: Family History  Problem Relation Age of Onset  . Hyperlipidemia Mother   . Heart disease Father   . Mesothelioma Father   . Dementia Sister     Social History:  reports that he has been smoking cigarettes. He has a 45.00 pack-year smoking history. He has never used smokeless tobacco. He reports current alcohol use of about 28.0 standard drinks of alcohol per week. He reports that he does not use drugs.  Physical Exam: BP 128/76 (BP Location: Left Arm, Patient Position: Sitting, Cuff Size: Normal)   Pulse 94   Ht 6' (1.829 m)   Wt 205 lb (93 kg)   BMI 27.80 kg/m    Constitutional:  Alert and oriented, No acute distress. Cardiovascular: No clubbing, cyanosis, or edema. Respiratory: Normal respiratory effort, no increased work of breathing. GI: Abdomen is soft, nontender, nondistended, no abdominal masses GU:  Phallus with widely patent meatus, testicles 20 cc and descended bilaterally.  Left epididymal cyst 6 cm, minimally tender Lymph: No cervical or inguinal lymphadenopathy. Skin: No rashes, bruises or suspicious lesions. Neurologic: Grossly intact, no focal deficits, moving all 4 extremities. Psychiatric: Normal mood and affect.  Pertinent Imaging: I have personally reviewed the scrotal ultrasound today.  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  No solid testicular masses.  Assessment & Plan:   In summary, the patient is a 64 year old male with a 6 cm left spermatocele.  We discussed the benign nature of a spermatocele and the reassuring ultrasound findings.  We discussed these do not require treatment unless they are bulky or bothersome in nature.  He is interested in removal.  We discussed the risks and benefits of surgery at length including bleeding, infection, and recurrence.  His insurance recently changed and  is out of network and he would like to pursue excision at [facility] with in network which is very reasonable.  Follow-up as needed, will pursue spermatocelectomy at [facility] within his network  I spent 30 total minutes on the day of the encounter including pre-visit review of the medical record, face-to-face time with the patient, and post visit ordering of labs/imaging/tests.  Legrand Rams, MD 10/23/2019  [facility] [address] [place], [place] [address] [phone]

## 2019-10-23 NOTE — Telephone Encounter (Signed)
Called pt informed him of the need to have u/s prior to his appointment pt gave verbal understanding. Advised pt to come with full bladder and to be NPO. Pt agrees. Orders placed. Pt scheduled at 1pm.

## 2019-10-23 NOTE — Patient Instructions (Signed)
  Spermatocele  A spermatocele is a fluid-filled sac (cyst) inside the scrotum. This type of cyst often forms at the top of the testicle where sperm is stored (epididymis). The cyst sometimes forms along the tube that carries sperm away from the epididymis (vas deferens). Spermatoceles are usually painless. Most cysts are small, but they can grow larger. Spermatoceles are not cancerous (are benign). What are the causes? The cause of this condition is not known. What are the signs or symptoms? In most cases, small cysts do not cause symptoms. If you do have symptoms, they may include:  Dull pain.  A feeling of heaviness.  An enlargement of your scrotum, if the cyst is large. How is this diagnosed? This condition is diagnosed based on a physical exam. You or your health care provider may notice the cyst when feeling your scrotum. Your provider may shine a light through (transilluminate) your scrotum to see if light will pass through the cyst. You may also have an ultrasound of your scrotum to rule out a tumor. How is this treated? Small spermatoceles do not need to be treated. If the spermatocele has grown large or is uncomfortable, surgery to remove the cyst may be recommended. Follow these instructions at home:  Watch your spermatocele for any changes.  Keep all follow-up visits as told by your health care provider. This is important. Contact a health care provider if:  Your spermatocele gets larger.  You have pain in your scrotum.  Your spermatocele comes back after treatment. This information is not intended to replace advice given to you by your health care provider. Make sure you discuss any questions you have with your health care provider. Document Revised: 07/15/2017 Document Reviewed: 08/17/2015 Elsevier Patient Education  2020 Elsevier Inc.  

## 2019-11-06 ENCOUNTER — Telehealth: Payer: Self-pay | Admitting: *Deleted

## 2019-11-06 DIAGNOSIS — Z87891 Personal history of nicotine dependence: Secondary | ICD-10-CM

## 2019-11-06 NOTE — Telephone Encounter (Signed)
(  11/06/19) Pt has been notified that lung cancer screening CT scan is due currently or will be in near future. Confirmed pt is within appropriate age range, and asymptomatic. Pt recently noticed that he has a mass in his testes. His urologist did a scrotal ultrasound and concluded that masses were benign cysts. Pt will be having surgery to excise the masses in the beginning of April. Pt denies any other illness that could prevent curative treatment for lung cancer if found. Verified smoking history (Current Smoker,1 ppd ). Pt is agreeable for CT scan being scheduled, no day or time preference, he just notes that it cannot interfere with his upcoming surgery.  SRW

## 2019-11-09 NOTE — Addendum Note (Signed)
Addended by: Jonne Ply on: 11/09/2019 01:10 PM   Modules accepted: Orders

## 2019-11-09 NOTE — Telephone Encounter (Signed)
Smoking history: current, 46 pack year 

## 2019-11-12 ENCOUNTER — Encounter: Payer: Self-pay | Admitting: *Deleted

## 2019-11-12 ENCOUNTER — Other Ambulatory Visit: Payer: Self-pay | Admitting: Internal Medicine

## 2019-11-12 DIAGNOSIS — Z87891 Personal history of nicotine dependence: Secondary | ICD-10-CM

## 2019-11-12 NOTE — Telephone Encounter (Signed)
Patient is out of network for lung screening at our location. Voicemail and mychart message sent to notify patient to contact his PCP to have referral sent to [facility] for his lung screening scan.

## 2019-11-12 NOTE — Telephone Encounter (Signed)
I have put in a referral to [facility] for the LDCT lung cancer screening.  We will see what happens.

## 2019-11-14 ENCOUNTER — Ambulatory Visit: Payer: BLUE CROSS/BLUE SHIELD

## 2019-11-22 ENCOUNTER — Ambulatory Visit: Payer: BLUE CROSS/BLUE SHIELD | Admitting: Internal Medicine

## 2019-12-18 ENCOUNTER — Telehealth: Payer: Self-pay

## 2019-12-18 NOTE — Telephone Encounter (Signed)
Called and left a VM asking pt to call [facility] radiology ( left # on the VM for pt to call ) to schedule his yearly lung screening CT Scan.  CM

## 2019-12-18 NOTE — Telephone Encounter (Signed)
-----   Message from Reubin Milan, MD sent at 12/14/2019  3:57 PM EDT ----- Regarding: RE: CT CHEST LUNG CANCER SCREENING Please see if you can get in touch with the patient. ----- Message ----- From: Retta Diones Sent: 12/14/2019  11:55 AM EDT To: Reubin Milan, MD Subject: CT CHEST LUNG CANCER SCREENING                 Per [facility] Radiology, they have contacted this patient 6 times to try to schedule. They have left several messages with no return call to schedule.

## 2019-12-19 ENCOUNTER — Other Ambulatory Visit: Payer: Self-pay

## 2019-12-19 DIAGNOSIS — Z86711 Personal history of pulmonary embolism: Secondary | ICD-10-CM

## 2019-12-19 MED ORDER — WARFARIN SODIUM 7.5 MG PO TABS: 7.5000 mg | ORAL_TABLET | Freq: Every day | ORAL | 1 refills | Status: DC

## 2019-12-19 NOTE — Telephone Encounter (Signed)
Patient returning call to [facility], requesting call back.

## 2019-12-19 NOTE — Telephone Encounter (Signed)
Pt requesting call back

## 2019-12-19 NOTE — Telephone Encounter (Signed)
Called and spoke with pt. He will call and schedule his lung CT. He also needed a RF on Warfarin. Sent this in .  CM

## 2020-04-11 ENCOUNTER — Telehealth: Payer: Self-pay | Admitting: Internal Medicine

## 2020-04-11 ENCOUNTER — Other Ambulatory Visit: Payer: Self-pay

## 2020-04-11 DIAGNOSIS — E7800 Pure hypercholesterolemia, unspecified: Secondary | ICD-10-CM

## 2020-04-11 DIAGNOSIS — I10 Essential (primary) hypertension: Secondary | ICD-10-CM

## 2020-04-11 MED ORDER — LISINOPRIL-HYDROCHLOROTHIAZIDE 20-25 MG PO TABS: 1.0000 | ORAL_TABLET | Freq: Every day | ORAL | 0 refills | Status: DC

## 2020-04-11 MED ORDER — ROSUVASTATIN CALCIUM 20 MG PO TABS: 20.0000 mg | ORAL_TABLET | Freq: Every day | ORAL | 0 refills | Status: DC

## 2020-04-11 NOTE — Telephone Encounter (Signed)
Rfs placed. Needs appt for other further Rfs.  KP

## 2020-04-11 NOTE — Telephone Encounter (Signed)
Called pt left VM that RF was sent in to [facility] in [place]. Name was stated on VM.  KP

## 2020-04-11 NOTE — Telephone Encounter (Signed)
Medication Refill - Medication: lisinopril/HCTZ and Rosuvastatin  Has the patient contacted their pharmacy? No. (Agent: If no, request that the patient contact the pharmacy for the refill.) (Agent: If yes, when and what did the pharmacy advise?)  Preferred Pharmacy (with phone number or street name): [id number] - [place], [place] - [address] [place] [address]  Agent: Please be advised that RX refills may take up to 3 business days. We ask that you follow-up with your pharmacy.

## 2020-06-24 ENCOUNTER — Ambulatory Visit: Payer: Medicare HMO | Admitting: Urology

## 2020-06-24 ENCOUNTER — Other Ambulatory Visit: Payer: Self-pay

## 2020-06-24 VITALS — BP 175/105 | HR 67 | Ht 72.0 in | Wt 205.0 lb

## 2020-06-24 DIAGNOSIS — N4340 Spermatocele of epididymis, unspecified: Secondary | ICD-10-CM | POA: Diagnosis not present

## 2020-06-24 NOTE — Progress Notes (Signed)
   06/24/2020 11:28 AM   [name] June 22, 1956 [id number]  Reason for visit: Follow up left spermatocele  HPI: I saw [name] and his wife in urology clinic for follow-up of a left-sided spermatocele.  He requested an appointment and was added to my schedule today.  Briefly, I saw him in March 2021 for evaluation of left-sided scrotal swelling, and an ultrasound at that time showed a 6 cm benign-appearing epididymal cyst.  He was minimally bothered at that time, and was not having any pain.  He is on Coumadin for history of pulmonary embolism.    [facility] was not within his insurance network at that time, and he requested a referral to [facility] for consideration of spermatocelectomy.  He saw Dr. Celso Sickle on 12/03/2019, and observation was recommended and the patient agreed.  He reports he feels like it may have enlarged some over the last few months, and he is interested in re-discussing surgery.  Apparently his insurance is changed and [facility] is within network at this time.  On exam today, he has a 6 cm left-sided spermatocele that is nontender, and appears minimally changed from prior exam in March 2021.  I had another long conversation with the patient and his wife today discussing options including observation, repeat scrotal ultrasound to evaluate change in size, or spermatocelectomy.  Risks and benefits were discussed at length.  I encouraged him to consider observation as he is minimally bothered, and it is not painful.  I think he has a fair amount of anxiety regarding his spermatocelectomy.  He is amenable to observation for the time being, and will think about his options moving forward.  He likely would want to repeat a scrotal ultrasound to see if it is increasing in size before scheduling any surgery.  He would like to follow-up on an as-needed basis which is very reasonable.  Follow-up as needed.  Okay to schedule repeat scrotal ultrasound if patient calls to request repeat  imaging   Sondra Come, MD  [facility] [address] [place], [place] [address] [phone]

## 2020-06-24 NOTE — Patient Instructions (Signed)
  Spermatocele  A spermatocele is a fluid-filled sac (cyst) inside the scrotum. This type of cyst often forms at the top of the testicle where sperm is stored (epididymis). The cyst sometimes forms along the tube that carries sperm away from the epididymis (vas deferens). Spermatoceles are usually painless. Most cysts are small, but they can grow larger. Spermatoceles are not cancerous (are benign). What are the causes? The cause of this condition is not known. What are the signs or symptoms? In most cases, small cysts do not cause symptoms. If you do have symptoms, they may include:  Dull pain.  A feeling of heaviness.  An enlargement of your scrotum, if the cyst is large. How is this diagnosed? This condition is diagnosed based on a physical exam. You or your health care provider may notice the cyst when feeling your scrotum. Your provider may shine a light through (transilluminate) your scrotum to see if light will pass through the cyst. You may also have an ultrasound of your scrotum to rule out a tumor. How is this treated? Small spermatoceles do not need to be treated. If the spermatocele has grown large or is uncomfortable, surgery to remove the cyst may be recommended. Follow these instructions at home:  Watch your spermatocele for any changes.  Keep all follow-up visits as told by your health care provider. This is important. Contact a health care provider if:  Your spermatocele gets larger.  You have pain in your scrotum.  Your spermatocele comes back after treatment. This information is not intended to replace advice given to you by your health care provider. Make sure you discuss any questions you have with your health care provider. Document Revised: 07/15/2017 Document Reviewed: 08/17/2015 Elsevier Patient Education  2020 Elsevier Inc.  

## 2020-07-08 ENCOUNTER — Other Ambulatory Visit: Payer: Self-pay | Admitting: Internal Medicine

## 2020-07-08 DIAGNOSIS — E7800 Pure hypercholesterolemia, unspecified: Secondary | ICD-10-CM

## 2020-07-08 DIAGNOSIS — Z86711 Personal history of pulmonary embolism: Secondary | ICD-10-CM

## 2020-07-08 NOTE — Telephone Encounter (Signed)
Requested medication (s) are due for refill today: yes  Requested medication (s) are on the active medication list: yes  Last refill:  12/19/19 #96 1 refill  Future visit scheduled: no  Notes to clinic:  not delegated per protocol; last INR 10/17/19     Requested Prescriptions  Pending Prescriptions Disp Refills   warfarin (COUMADIN) 7.5 MG tablet [Pharmacy Med Name: Warfarin Sodium 7.5 MG Oral Tablet] 96 tablet 0    Sig: TAKE 1 TABLET BY MOUTH ONCE DAILY. AND TAKE AN EXTRA ONE-HALF TABLET ONCE A WEEK      Hematology:  Anticoagulants - warfarin Failed - 07/08/2020 11:51 AM      Failed - This refill cannot be delegated      Failed - If the patient is managed by Coumadin Clinic - route to their Pool. If not, forward to the provider.      Failed - INR in normal range and within 30 days    INR  Date Value Ref Range Status  10/17/2019 2.8 (H) 0.9 - 1.2 Final    Comment:    Reference interval is for non-anticoagulated patients. Suggested INR therapeutic range for Vitamin K antagonist therapy:    Standard Dose (moderate intensity                   therapeutic range):       2.0 - 3.0    Higher intensity therapeutic range       2.5 - 3.5           Failed - Valid encounter within last 3 months    Recent Outpatient Visits           8 months ago Essential hypertension   [facility] Reubin Milan, MD   1 year ago Essential hypertension   [facility] Reubin Milan, MD   1 year ago Anticoagulated on warfarin   [facility] Reubin Milan, MD   2 years ago Upper respiratory tract infection, unspecified type   [facility] Reubin Milan, MD   2 years ago Essential hypertension   [facility] Reubin Milan, MD               Signed Prescriptions Disp Refills   rosuvastatin (CRESTOR) 20 MG tablet 90 tablet 0    Sig: Take 1 tablet by mouth once daily      Cardiovascular:  Antilipid - Statins Failed - 07/08/2020  11:51 AM      Failed - LDL in normal range and within 360 days    LDL Chol Calc (NIH)  Date Value Ref Range Status  10/17/2019 101 (H) 0 - 99 mg/dL Final          Passed - Total Cholesterol in normal range and within 360 days    Cholesterol, Total  Date Value Ref Range Status  10/17/2019 191 100 - 199 mg/dL Final          Passed - HDL in normal range and within 360 days    HDL  Date Value Ref Range Status  10/17/2019 66 >39 mg/dL Final          Passed - Triglycerides in normal range and within 360 days    Triglycerides  Date Value Ref Range Status  10/17/2019 138 0 - 149 mg/dL Final          Passed - Patient is not pregnant      Passed - Valid encounter within  last 12 months    Recent Outpatient Visits           8 months ago Essential hypertension   [facility] Reubin Milan, MD   1 year ago Essential hypertension   [facility] Reubin Milan, MD   1 year ago Anticoagulated on warfarin   [facility] Reubin Milan, MD   2 years ago Upper respiratory tract infection, unspecified type   [facility] Reubin Milan, MD   2 years ago Essential hypertension   [facility] Reubin Milan, MD

## 2020-08-01 ENCOUNTER — Telehealth: Payer: Self-pay

## 2020-08-01 NOTE — Telephone Encounter (Signed)
Patient overdue for a blood pressure follow up. Please call him to schedule this.   Thank you.

## 2020-08-06 ENCOUNTER — Ambulatory Visit (INDEPENDENT_AMBULATORY_CARE_PROVIDER_SITE_OTHER): Payer: Medicare HMO | Admitting: Internal Medicine

## 2020-08-06 ENCOUNTER — Encounter: Payer: Self-pay | Admitting: Internal Medicine

## 2020-08-06 ENCOUNTER — Other Ambulatory Visit: Payer: Self-pay

## 2020-08-06 VITALS — BP 134/82 | HR 85 | Temp 98.0°F | Ht 72.0 in | Wt 200.0 lb

## 2020-08-06 DIAGNOSIS — D6869 Other thrombophilia: Secondary | ICD-10-CM | POA: Insufficient documentation

## 2020-08-06 DIAGNOSIS — Z7901 Long term (current) use of anticoagulants: Secondary | ICD-10-CM | POA: Diagnosis not present

## 2020-08-06 DIAGNOSIS — I10 Essential (primary) hypertension: Secondary | ICD-10-CM

## 2020-08-06 DIAGNOSIS — E7800 Pure hypercholesterolemia, unspecified: Secondary | ICD-10-CM | POA: Diagnosis not present

## 2020-08-06 DIAGNOSIS — Z23 Encounter for immunization: Secondary | ICD-10-CM | POA: Diagnosis not present

## 2020-08-06 NOTE — Progress Notes (Signed)
Date:  08/06/2020   Name:  Tyler Estes   DOB:  [birth year]   MRN:  [id number]   Chief Complaint: Hypertension  Hypertension This is a chronic problem. The problem is controlled. Associated symptoms include shortness of breath (chronic mild 2/2 smoking). Pertinent negatives include no blurred vision, chest pain, headaches, palpitations or peripheral edema. Risk factors for coronary artery disease include smoking/tobacco exposure. Past treatments include ACE inhibitors and diuretics. The current treatment provides significant improvement. There are no compliance problems.   Hyperlipidemia This is a chronic problem. The problem is controlled. Associated symptoms include shortness of breath (chronic mild 2/2 smoking). Pertinent negatives include no chest pain. Current antihyperlipidemic treatment includes statins.   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  No bleeding issues noted, despite minor skin injuries.   Lab Results  Component Value Date   CREATININE 1.10 10/17/2019   BUN 20 10/17/2019   NA 142 10/17/2019   K 4.2 10/17/2019   CL 105 10/17/2019   CO2 21 10/17/2019   Lab Results  Component Value Date   CHOL 191 10/17/2019   HDL 66 10/17/2019   LDLCALC 101 (H) 10/17/2019   TRIG 138 10/17/2019   CHOLHDL 2.9 10/17/2019   Lab Results  Component Value Date   TSH 1.40 09/25/2013   No results found for: HGBA1C Lab Results  Component Value Date   WBC 4.0 10/17/2019   HGB 17.2 10/17/2019   HCT 48.2 10/17/2019   MCV 97 10/17/2019   PLT 148 (L) 10/17/2019   Lab Results  Component Value Date   ALT 33 10/17/2019   AST 26 10/17/2019   ALKPHOS 56 10/17/2019   BILITOT 0.4 10/17/2019     Review of Systems  Constitutional: Negative for chills, fatigue and fever.  Eyes: Negative for blurred vision.  Respiratory: Positive for shortness of breath (chronic mild 2/2 smoking). Negative for cough, chest tightness and wheezing.   Cardiovascular: Negative for chest pain,  palpitations and leg swelling.  Gastrointestinal: Negative for abdominal pain and blood in stool.  Genitourinary: Positive for scrotal swelling (not painful, not changing). Negative for hematuria.  Neurological: Negative for dizziness and headaches.  Psychiatric/Behavioral: Negative for dysphoric mood and sleep disturbance. The patient is not nervous/anxious.     Patient Active Problem List   Diagnosis Date Noted  . Acquired thrombophilia (HCC) 08/06/2020  . Aortic atherosclerosis (HCC) 10/12/2018  . Anticoagulated on warfarin 10/12/2018  . Personal history of tobacco use, presenting hazards to health 08/31/2018  . Benign colon polyp 01/10/2015  . Hypercholesteremia 01/10/2015  . Essential hypertension 01/10/2015  . Hx MRSA infection 01/10/2015  . History of pulmonary embolus (PE) 01/10/2015  . 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 Tobacco use disorder 01/10/2015    No Known Allergies  Past Surgical History:  Procedure Laterality Date  . COLONOSCOPY    . COLONOSCOPY WITH PROPOFOL N/A 01/27/2018   Procedure: COLONOSCOPY WITH PROPOFOL;  Surgeon: Christena Deem, MD;  [facility];  Service: Endoscopy;  Laterality: N/A;  . VASCULAR SURGERY  2013   Leg  . VASECTOMY      Social History   Tobacco Use  . Smoking status: Current Every Day Smoker    Packs/day: 1.00    Years: 45.00    Pack years: 45.00    Types: Cigarettes  . Smokeless tobacco: Never Used  . Tobacco comment: currently .75ppd  Vaping Use  . Vaping Use: Never used  Substance Use Topics  . Alcohol use: Yes  Alcohol/week: 28.0 standard drinks    Types: 28 Cans of beer per week    Comment: 4 beers a day  . Drug use: No    Comment: Quit in 1997     Medication list has been reviewed and updated.  Current Meds  Medication Sig  . lisinopril-hydrochlorothiazide (ZESTORETIC) 20-25 MG tablet Take 1 tablet by mouth daily.  . rosuvastatin (CRESTOR) 20 MG tablet Take 1 tablet by mouth once daily  . warfarin  (COUMADIN) 7.5 MG tablet TAKE 1 TABLET BY MOUTH ONCE DAILY. AND TAKE AN EXTRA ONE-HALF TABLET ONCE A WEEK    PHQ 2/9 Scores 08/06/2020 10/17/2019 05/21/2019 10/12/2018  PHQ - 2 Score 0 0 0 0  PHQ- 9 Score 0 0 - -    GAD 7 : Generalized Anxiety Score 08/06/2020  Nervous, Anxious, on Edge 0  Control/stop worrying 0  Worry too much - different things 0  Trouble relaxing 0  Restless 0  Easily annoyed or irritable 0  Afraid - awful might happen 0  Total GAD 7 Score 0    BP Readings from Last 3 Encounters:  08/06/20 134/82  06/24/20 (!) 175/105  10/23/19 128/76    Physical Exam Vitals and nursing note reviewed.  Constitutional:      General: He is not in acute distress.    Appearance: Normal appearance. He is well-developed.  HENT:     Head: Normocephalic and atraumatic.  Neck:     Vascular: No carotid bruit.  Cardiovascular:     Rate and Rhythm: Normal rate and regular rhythm.     Pulses: Normal pulses.  Pulmonary:     Effort: Pulmonary effort is normal. No respiratory distress.     Breath sounds: No wheezing or rhonchi.  Musculoskeletal:     Cervical back: Normal range of motion.     Right lower leg: No edema.     Left lower leg: No edema.  Lymphadenopathy:     Cervical: No cervical adenopathy.  Skin:    General: Skin is warm and dry.     Findings: No rash.  Neurological:     General: No focal deficit present.     Mental Status: He is alert and oriented to person, place, and time.  Psychiatric:        Mood and Affect: Mood and affect and mood normal.        Behavior: Behavior normal.     Wt Readings from Last 3 Encounters:  08/06/20 200 lb (90.7 kg)  06/24/20 205 lb (93 kg)  10/23/19 205 lb (93 kg)    BP 134/82   Pulse 85   Temp 98 F (36.7 C) (Oral)   Ht 6' (1.829 m)   Wt 200 lb (90.7 kg)   SpO2 94%   BMI 27.12 kg/m   Assessment and Plan: 1. Essential hypertension Clinically stable exam with well controlled BP on lisinopril hct. Tolerating  medications without side effects at this time. Pt to continue current regimen and low sodium diet; benefits of regular exercise as able discussed.  2. Hypercholesteremia Tolerating statin medication without side effects at this time LDL is at goal of < 70 on current dose Continue same therapy without change at this time.  3. 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 Acquired thrombophilia (HCC)  5. Need for vaccination for pneumococcus - Pneumococcal conjugate vaccine 13-valent IM   Partially dictated using Animal nutritionist. Any errors are unintentional.  Bari Edward, MD [facility]  08/06/2020

## 2020-08-07 ENCOUNTER — Telehealth: Payer: Self-pay | Admitting: *Deleted

## 2020-08-07 LAB — PROTIME-INR
INR: 2.3 — ABNORMAL HIGH (ref 0.9–1.2)
Prothrombin Time: 23.2 s — ABNORMAL HIGH (ref 9.1–12.0)

## 2020-08-07 NOTE — Telephone Encounter (Signed)
Attempted to contact and schedule lung screening scan. Message left for patient to call back to schedule. 

## 2020-08-19 ENCOUNTER — Other Ambulatory Visit: Payer: Self-pay | Admitting: *Deleted

## 2020-08-19 DIAGNOSIS — Z87891 Personal history of nicotine dependence: Secondary | ICD-10-CM

## 2020-08-19 DIAGNOSIS — Z122 Encounter for screening for malignant neoplasm of respiratory organs: Secondary | ICD-10-CM

## 2020-08-19 NOTE — Progress Notes (Signed)
Contacted and scheduled for annual lung screening scan. Patient is a current smoker with a 47 pack year history.  

## 2020-09-18 ENCOUNTER — Other Ambulatory Visit: Payer: Self-pay

## 2020-09-18 ENCOUNTER — Ambulatory Visit
Admission: RE | Admit: 2020-09-18 | Discharge: 2020-09-18 | Disposition: A | Discharge status: Home / Self Care | Payer: Medicare HMO | Source: Ambulatory Visit | Attending: Oncology | Admitting: Oncology

## 2020-09-18 ENCOUNTER — Other Ambulatory Visit: Payer: Self-pay | Admitting: Internal Medicine

## 2020-09-18 ENCOUNTER — Encounter: Payer: Self-pay | Admitting: Internal Medicine

## 2020-09-18 DIAGNOSIS — Z87891 Personal history of nicotine dependence: Secondary | ICD-10-CM | POA: Diagnosis not present

## 2020-09-18 DIAGNOSIS — I77810 Thoracic aortic ectasia: Secondary | ICD-10-CM | POA: Insufficient documentation

## 2020-09-18 DIAGNOSIS — Z122 Encounter for screening for malignant neoplasm of respiratory organs: Secondary | ICD-10-CM | POA: Diagnosis not present

## 2020-09-18 DIAGNOSIS — I712 Thoracic aortic aneurysm, without rupture: Secondary | ICD-10-CM

## 2020-09-18 DIAGNOSIS — J432 Centrilobular emphysema: Secondary | ICD-10-CM | POA: Insufficient documentation

## 2020-09-18 DIAGNOSIS — F17210 Nicotine dependence, cigarettes, uncomplicated: Secondary | ICD-10-CM | POA: Diagnosis not present

## 2020-09-18 DIAGNOSIS — I7121 Aneurysm of the ascending aorta, without rupture: Secondary | ICD-10-CM

## 2020-09-22 ENCOUNTER — Encounter: Payer: Self-pay | Admitting: *Deleted

## 2020-09-30 ENCOUNTER — Ambulatory Visit
Admission: RE | Admit: 2020-09-30 | Discharge: 2020-09-30 | Disposition: A | Discharge status: Home / Self Care | Payer: Medicare HMO | Source: Ambulatory Visit | Attending: Internal Medicine | Admitting: Internal Medicine

## 2020-09-30 ENCOUNTER — Other Ambulatory Visit: Payer: Self-pay

## 2020-09-30 DIAGNOSIS — I712 Thoracic aortic aneurysm, without rupture: Secondary | ICD-10-CM | POA: Insufficient documentation

## 2020-09-30 DIAGNOSIS — I7121 Aneurysm of the ascending aorta, without rupture: Secondary | ICD-10-CM

## 2020-09-30 LAB — POCT I-STAT CREATININE: Creatinine, Ser: 1 mg/dL (ref 0.61–1.24)

## 2020-09-30 MED ORDER — IOHEXOL 350 MG/ML SOLN
100.0000 mL | Freq: Once | INTRAVENOUS | Status: AC | PRN
  Administered 2020-09-30: 100 mL via INTRAVENOUS

## 2020-10-06 ENCOUNTER — Other Ambulatory Visit: Payer: Self-pay | Admitting: Internal Medicine

## 2020-10-06 DIAGNOSIS — E7800 Pure hypercholesterolemia, unspecified: Secondary | ICD-10-CM

## 2020-10-30 ENCOUNTER — Other Ambulatory Visit: Payer: Self-pay | Admitting: Internal Medicine

## 2020-10-30 DIAGNOSIS — Z86711 Personal history of pulmonary embolism: Secondary | ICD-10-CM

## 2020-10-30 NOTE — Telephone Encounter (Signed)
Requested medication (s) are due for refill today: Yes  Requested medication (s) are on the active medication list: Yes  Last refill:  07/08/20  Future visit scheduled: Yes  Notes to clinic:  See request.    Requested Prescriptions  Pending Prescriptions Disp Refills   warfarin (COUMADIN) 7.5 MG tablet [Pharmacy Med Name: Warfarin Sodium 7.5 MG Oral Tablet] 96 tablet 0    Sig: TAKE 1 TABLET BY MOUTH ONCE DAILY. AND TAKE AN EXTRA ONE-HALF TABLET ONCE A WEEK      Hematology:  Anticoagulants - warfarin Failed - 10/30/2020 10:28 AM      Failed - This refill cannot be delegated      Failed - If the patient is managed by Coumadin Clinic - route to their Pool. If not, forward to the provider.      Failed - INR in normal range and within 30 days    INR  Date Value Ref Range Status  08/06/2020 2.3 (H) 0.9 - 1.2 Final    Comment:    Reference interval is for non-anticoagulated patients. Suggested INR therapeutic range for Vitamin K antagonist therapy:    Standard Dose (moderate intensity                   therapeutic range):       2.0 - 3.0    Higher intensity therapeutic range       2.5 - 3.5           Passed - Valid encounter within last 3 months    Recent Outpatient Visits           2 months ago Essential hypertension   [facility] Reubin Milan, MD   1 year ago Essential hypertension   [facility] Reubin Milan, MD   1 year ago Essential hypertension   [facility] Reubin Milan, MD   2 years ago Anticoagulated on warfarin   [facility] Reubin Milan, MD   2 years ago Upper respiratory tract infection, unspecified type   [facility] Reubin Milan, MD       Future Appointments             In 1 month Judithann Graves Nyoka Cowden, MD [facility]

## 2020-12-11 ENCOUNTER — Other Ambulatory Visit: Payer: Self-pay

## 2020-12-11 ENCOUNTER — Encounter: Payer: Self-pay | Admitting: Internal Medicine

## 2020-12-11 ENCOUNTER — Ambulatory Visit (INDEPENDENT_AMBULATORY_CARE_PROVIDER_SITE_OTHER): Payer: Medicare HMO | Admitting: Internal Medicine

## 2020-12-11 VITALS — BP 162/88 | HR 77 | Temp 98.4°F | Ht 72.0 in | Wt 201.0 lb

## 2020-12-11 DIAGNOSIS — N4340 Spermatocele of epididymis, unspecified: Secondary | ICD-10-CM | POA: Diagnosis not present

## 2020-12-11 DIAGNOSIS — Z0000 Encounter for general adult medical examination without abnormal findings: Secondary | ICD-10-CM

## 2020-12-11 DIAGNOSIS — E7800 Pure hypercholesterolemia, unspecified: Secondary | ICD-10-CM

## 2020-12-11 DIAGNOSIS — D6869 Other thrombophilia: Secondary | ICD-10-CM

## 2020-12-11 DIAGNOSIS — J432 Centrilobular emphysema: Secondary | ICD-10-CM | POA: Diagnosis not present

## 2020-12-11 DIAGNOSIS — I7121 Aneurysm of the ascending aorta, without rupture: Secondary | ICD-10-CM

## 2020-12-11 DIAGNOSIS — Z125 Encounter for screening for malignant neoplasm of prostate: Secondary | ICD-10-CM

## 2020-12-11 DIAGNOSIS — Z7901 Long term (current) use of anticoagulants: Secondary | ICD-10-CM | POA: Diagnosis not present

## 2020-12-11 DIAGNOSIS — I10 Essential (primary) hypertension: Secondary | ICD-10-CM | POA: Diagnosis not present

## 2020-12-11 DIAGNOSIS — Z86711 Personal history of pulmonary embolism: Secondary | ICD-10-CM

## 2020-12-11 DIAGNOSIS — I712 Thoracic aortic aneurysm, without rupture: Secondary | ICD-10-CM

## 2020-12-11 DIAGNOSIS — R739 Hyperglycemia, unspecified: Secondary | ICD-10-CM | POA: Diagnosis not present

## 2020-12-11 LAB — POCT URINALYSIS DIPSTICK
Bilirubin, UA: NEGATIVE
Blood, UA: NEGATIVE
Glucose, UA: NEGATIVE
Ketones, UA: NEGATIVE
Leukocytes, UA: NEGATIVE
Nitrite, UA: NEGATIVE
Protein, UA: NEGATIVE
Spec Grav, UA: 1.025 (ref 1.010–1.025)
Urobilinogen, UA: 0.2 E.U./dL
pH, UA: 6 (ref 5.0–8.0)

## 2020-12-11 MED ORDER — ROSUVASTATIN CALCIUM 20 MG PO TABS: 20.0000 mg | ORAL_TABLET | Freq: Every day | ORAL | 1 refills | Status: DC

## 2020-12-11 MED ORDER — WARFARIN SODIUM 7.5 MG PO TABS: ORAL_TABLET | ORAL | 3 refills | Status: DC

## 2020-12-11 NOTE — Progress Notes (Signed)
Date:  12/11/2020   Name:  Tyler Estes   DOB:  August 03, 1956   MRN:  [id number]   Chief Complaint: Annual Exam  Tyler Estes is a 65 y.o. male who presents today for his Complete Annual Exam. He feels well. He reports exercising yard work. He reports he is sleeping fairly well.   Colonoscopy: 01/2018  Immunization History  Administered Date(s) Administered  . Moderna Sars-Covid-2 Vaccination 11/30/2019, 12/27/2019  . Pneumococcal Conjugate-13 08/06/2020  . Pneumococcal Polysaccharide-23 03/02/2016  . Tdap 01/28/2014    Hypertension This is a chronic problem. The problem is controlled. Pertinent negatives include no chest pain, headaches, palpitations or shortness of breath. Past treatments include ACE inhibitors and diuretics. The current treatment provides significant improvement. There is no history of kidney disease, CAD/MI or CVA.  Hyperlipidemia This is a chronic problem. The problem is controlled. Pertinent negatives include no chest pain, myalgias or shortness of breath. Current antihyperlipidemic treatment includes statins. The current treatment provides significant improvement of lipids.  Anticoagulation - on chronic warfarin for recurrent DVT/PE.  No bleeding issues noted. COPD - seen on CT; pt has mild chronic DOE.  He continues to play golf and walks a great part of the course.  Lab Results  Component Value Date   CREATININE 1.00 09/30/2020   BUN 20 10/17/2019   NA 142 10/17/2019   K 4.2 10/17/2019   CL 105 10/17/2019   CO2 21 10/17/2019   Lab Results  Component Value Date   CHOL 191 10/17/2019   HDL 66 10/17/2019   LDLCALC 101 (H) 10/17/2019   TRIG 138 10/17/2019   CHOLHDL 2.9 10/17/2019   Lab Results  Component Value Date   TSH 1.40 09/25/2013   No results found for: HGBA1C Lab Results  Component Value Date   WBC 4.0 10/17/2019   HGB 17.2 10/17/2019   HCT 48.2 10/17/2019   MCV 97 10/17/2019   PLT 148 (L) 10/17/2019   Lab Results  Component  Value Date   ALT 33 10/17/2019   AST 26 10/17/2019   ALKPHOS 56 10/17/2019   BILITOT 0.4 10/17/2019     Review of Systems  Constitutional: Negative for appetite change, chills, diaphoresis, fatigue and unexpected weight change.  HENT: Negative for hearing loss, tinnitus, trouble swallowing and voice change.   Eyes: Negative for visual disturbance.  Respiratory: Negative for choking, shortness of breath and wheezing.   Cardiovascular: Negative for chest pain, palpitations and leg swelling.  Gastrointestinal: Negative for abdominal pain, blood in stool, constipation and diarrhea.  Genitourinary: Negative for difficulty urinating, dysuria and frequency.  Musculoskeletal: Negative for arthralgias, back pain and myalgias.  Skin: Negative for color change and rash.  Neurological: Negative for dizziness, syncope and headaches.  Hematological: Negative for adenopathy.  Psychiatric/Behavioral: Negative for dysphoric mood and sleep disturbance.    Patient Active Problem List   Diagnosis Date Noted  . Spermatocele 12/11/2020  . Centrilobular emphysema (HCC) 09/18/2020  . Thoracic ascending aortic aneurysm (HCC) 09/18/2020  . Acquired thrombophilia (HCC) 08/06/2020  . Aortic atherosclerosis (HCC) 10/12/2018  . Anticoagulated on warfarin 10/12/2018  . Personal history of tobacco use, presenting hazards to health 08/31/2018  . Benign colon polyp 01/10/2015  . Hypercholesteremia 01/10/2015  . Essential hypertension 01/10/2015  . Hx MRSA infection 01/10/2015  . History of pulmonary embolus (PE) 01/10/2015  . 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 Tobacco use disorder 01/10/2015    No Known Allergies  Past Surgical History:  Procedure Laterality Date  . COLONOSCOPY    .  COLONOSCOPY WITH PROPOFOL N/A 01/27/2018   Procedure: COLONOSCOPY WITH PROPOFOL;  Surgeon: Christena Deem, MD;  Location[facility] ENDOSCOPY;  Service: Endoscopy;  Laterality: N/A;  . VASCULAR SURGERY  2013   Leg  . VASECTOMY       Social History   Tobacco Use  . Smoking status: Current Every Day Smoker    Packs/day: 1.00    Years: 45.00    Pack years: 45.00    Types: Cigarettes  . Smokeless tobacco: Never Used  . Tobacco comment: currently .75ppd  Vaping Use  . Vaping Use: Never used  Substance Use Topics  . Alcohol use: Yes    Alcohol/week: 28.0 standard drinks    Types: 28 Cans of beer per week    Comment: 4 beers a day  . Drug use: No    Comment: Quit in 1997     Medication list has been reviewed and updated.  Current Meds  Medication Sig  . lisinopril-hydrochlorothiazide (ZESTORETIC) 20-25 MG tablet Take 1 tablet by mouth daily.  . rosuvastatin (CRESTOR) 20 MG tablet Take 1 tablet by mouth once daily  . warfarin (COUMADIN) 7.5 MG tablet TAKE 1 TABLET BY MOUTH ONCE DAILY. AND TAKE AN EXTRA ONE-HALF TABLET ONCE A WEEK    PHQ 2/9 Scores 12/11/2020 08/06/2020 10/17/2019 05/21/2019  PHQ - 2 Score 0 0 0 0  PHQ- 9 Score 1 0 0 -    GAD 7 : Generalized Anxiety Score 12/11/2020 08/06/2020  Nervous, Anxious, on Edge 0 0  Control/stop worrying 1 0  Worry too much - different things 0 0  Trouble relaxing 0 0  Restless 0 0  Easily annoyed or irritable 0 0  Afraid - awful might happen 0 0  Total GAD 7 Score 1 0    BP Readings from Last 3 Encounters:  12/11/20 (!) 162/88  08/06/20 134/82  06/24/20 (!) 175/105    Physical Exam Vitals and nursing note reviewed.  Constitutional:      Appearance: Normal appearance. He is well-developed.  HENT:     Head: Normocephalic.     Right Ear: Tympanic membrane, ear canal and external ear normal.     Left Ear: Tympanic membrane, ear canal and external ear normal.     Nose: Nose normal.  Eyes:     Conjunctiva/sclera: Conjunctivae normal.     Pupils: Pupils are equal, round, and reactive to light.  Neck:     Thyroid: No thyromegaly.     Vascular: No carotid bruit.  Cardiovascular:     Rate and Rhythm: Normal rate and regular rhythm.     Heart sounds:  Normal heart sounds.  Pulmonary:     Effort: Pulmonary effort is normal.     Breath sounds: Normal breath sounds. No wheezing.  Chest:  Breasts:     Right: No mass.     Left: No mass.    Abdominal:     General: Abdomen is flat. Bowel sounds are normal.     Palpations: Abdomen is soft. There is no mass.     Tenderness: There is no abdominal tenderness.     Hernia: No hernia is present.  Genitourinary:    Comments: deferred Musculoskeletal:        General: Normal range of motion.     Cervical back: Normal range of motion and neck supple.     Right lower leg: No edema.     Left lower leg: No edema.  Lymphadenopathy:     Cervical: No cervical  adenopathy.  Skin:    General: Skin is warm and dry.     Capillary Refill: Capillary refill takes less than 2 seconds.  Neurological:     General: No focal deficit present.     Mental Status: He is alert and oriented to person, place, and time.     Deep Tendon Reflexes: Reflexes are normal and symmetric.  Psychiatric:        Attention and Perception: Attention normal.        Mood and Affect: Mood normal.        Behavior: Behavior normal.     Wt Readings from Last 3 Encounters:  12/11/20 201 lb (91.2 kg)  09/18/20 200 lb (90.7 kg)  08/06/20 200 lb (90.7 kg)    BP (!) 162/88   Pulse 77   Temp 98.4 F (36.9 C) (Oral)   Ht 6' (1.829 m)   Wt 201 lb (91.2 kg)   SpO2 95%   BMI 27.26 kg/m   Assessment and Plan: 1. Annual physical exam Continue healthy diet and exercise  2. Essential hypertension Not controlled - will double lisinopril/hctz to 2 per day and recheck in one month - CBC with Differential/Platelet - POCT urinalysis dipstick  3. Hypercholesteremia Tolerating statin medication without side effects at this time LDL is at goal of < 70 on current dose Continue same therapy without change at this time. - Comprehensive metabolic panel - Lipid panel  4. Anticoagulated on warfarin No bleeding issues and no  recurrence - Protime-INR  5. Prostate cancer screening DRE deferred - PSA  6. Spermatocele Followed by Urology  7. Centrilobular emphysema (HCC) He continues to smoke.  No increase in mild DOE. He declines trial of inhaler today He is participating in LDCT screening  8. Acquired thrombophilia (HCC) On Warfarin   Partially dictated using Animal nutritionist. Any errors are unintentional.  Bari Edward, MD [facility]  12/11/2020

## 2020-12-11 NOTE — Patient Instructions (Signed)
Take 2 lisinopril 20/hctz 25 until the next visit

## 2020-12-12 LAB — PROTIME-INR
INR: 3.3 — ABNORMAL HIGH (ref 0.9–1.2)
Prothrombin Time: 32.6 s — ABNORMAL HIGH (ref 9.1–12.0)

## 2020-12-12 LAB — CBC WITH DIFFERENTIAL/PLATELET
Basophils Absolute: 0 10*3/uL (ref 0.0–0.2)
Basos: 1 %
EOS (ABSOLUTE): 0.1 10*3/uL (ref 0.0–0.4)
Eos: 2 %
Hematocrit: 48 % (ref 37.5–51.0)
Hemoglobin: 16.9 g/dL (ref 13.0–17.7)
Immature Grans (Abs): 0 10*3/uL (ref 0.0–0.1)
Immature Granulocytes: 0 %
Lymphocytes Absolute: 1.1 10*3/uL (ref 0.7–3.1)
Lymphs: 33 %
MCH: 33.9 pg — ABNORMAL HIGH (ref 26.6–33.0)
MCHC: 35.2 g/dL (ref 31.5–35.7)
MCV: 96 fL (ref 79–97)
Monocytes Absolute: 0.4 10*3/uL (ref 0.1–0.9)
Monocytes: 11 %
Neutrophils Absolute: 1.7 10*3/uL (ref 1.4–7.0)
Neutrophils: 53 %
Platelets: 145 10*3/uL — ABNORMAL LOW (ref 150–450)
RBC: 4.99 x10E6/uL (ref 4.14–5.80)
RDW: 13.6 % (ref 11.6–15.4)
WBC: 3.2 10*3/uL — ABNORMAL LOW (ref 3.4–10.8)

## 2020-12-12 LAB — COMPREHENSIVE METABOLIC PANEL
ALT: 32 IU/L (ref 0–44)
AST: 24 IU/L (ref 0–40)
Albumin/Globulin Ratio: 2.3 — ABNORMAL HIGH (ref 1.2–2.2)
Albumin: 4.6 g/dL (ref 3.8–4.8)
Alkaline Phosphatase: 68 IU/L (ref 44–121)
BUN/Creatinine Ratio: 15 (ref 10–24)
BUN: 14 mg/dL (ref 8–27)
Bilirubin Total: 0.5 mg/dL (ref 0.0–1.2)
CO2: 22 mmol/L (ref 20–29)
Calcium: 9.3 mg/dL (ref 8.6–10.2)
Chloride: 104 mmol/L (ref 96–106)
Creatinine, Ser: 0.96 mg/dL (ref 0.76–1.27)
Globulin, Total: 2 g/dL (ref 1.5–4.5)
Glucose: 108 mg/dL — ABNORMAL HIGH (ref 65–99)
Potassium: 4 mmol/L (ref 3.5–5.2)
Sodium: 140 mmol/L (ref 134–144)
Total Protein: 6.6 g/dL (ref 6.0–8.5)
eGFR: 88 mL/min/{1.73_m2} (ref >59)

## 2020-12-12 LAB — LIPID PANEL
Chol/HDL Ratio: 3.3 ratio (ref 0.0–5.0)
Cholesterol, Total: 186 mg/dL (ref 100–199)
HDL: 56 mg/dL (ref >39)
LDL Chol Calc (NIH): 94 mg/dL (ref 0–99)
Triglycerides: 211 mg/dL — ABNORMAL HIGH (ref 0–149)
VLDL Cholesterol Cal: 36 mg/dL (ref 5–40)

## 2020-12-12 LAB — PSA: Prostate Specific Ag, Serum: 0.6 ng/mL (ref 0.0–4.0)

## 2020-12-16 LAB — SPECIMEN STATUS REPORT

## 2020-12-16 LAB — HGB A1C W/O EAG: Hgb A1c MFr Bld: 5.7 % — ABNORMAL HIGH (ref 4.8–5.6)

## 2021-01-08 ENCOUNTER — Other Ambulatory Visit: Payer: Self-pay | Admitting: Internal Medicine

## 2021-01-08 DIAGNOSIS — I10 Essential (primary) hypertension: Secondary | ICD-10-CM

## 2021-01-08 NOTE — Telephone Encounter (Signed)
  Notes to clinic OV note states to double dose for one month and has a recheck next week. 5/31. Please assess then.

## 2021-01-11 ENCOUNTER — Other Ambulatory Visit: Payer: Self-pay | Admitting: Internal Medicine

## 2021-01-11 DIAGNOSIS — I10 Essential (primary) hypertension: Secondary | ICD-10-CM

## 2021-01-13 ENCOUNTER — Ambulatory Visit: Payer: Medicare HMO | Admitting: Internal Medicine

## 2021-01-13 ENCOUNTER — Other Ambulatory Visit: Payer: Self-pay

## 2021-01-13 ENCOUNTER — Encounter: Payer: Self-pay | Admitting: Internal Medicine

## 2021-01-13 VITALS — BP 148/96 | HR 83 | Temp 98.0°F | Resp 14 | Ht 72.0 in | Wt 201.0 lb

## 2021-01-13 DIAGNOSIS — I10 Essential (primary) hypertension: Secondary | ICD-10-CM | POA: Diagnosis not present

## 2021-01-13 DIAGNOSIS — I77810 Thoracic aortic ectasia: Secondary | ICD-10-CM | POA: Diagnosis not present

## 2021-01-13 MED ORDER — LISINOPRIL-HYDROCHLOROTHIAZIDE 20-25 MG PO TABS: 2.0000 | ORAL_TABLET | Freq: Every day | ORAL | 1 refills | Status: DC

## 2021-01-13 NOTE — Progress Notes (Signed)
Date:  01/13/2021   Name:  Tyler Estes   DOB:  [birth year]   MRN:  [id number]   Chief Complaint: Hypertension  Hypertension This is a chronic problem. The problem is uncontrolled (was advised to double meds last visit but he did not make the change due to miscommunication). Pertinent negatives include no chest pain, headaches, palpitations or shortness of breath. Past treatments include ACE inhibitors and diuretics. The current treatment provides moderate improvement.    Lab Results  Component Value Date   CREATININE 0.96 12/11/2020   BUN 14 12/11/2020   NA 140 12/11/2020   K 4.0 12/11/2020   CL 104 12/11/2020   CO2 22 12/11/2020   Lab Results  Component Value Date   CHOL 186 12/11/2020   HDL 56 12/11/2020   LDLCALC 94 12/11/2020   TRIG 211 (H) 12/11/2020   CHOLHDL 3.3 12/11/2020   Lab Results  Component Value Date   TSH 1.40 09/25/2013   Lab Results  Component Value Date   HGBA1C 5.7 (H) 12/11/2020   Lab Results  Component Value Date   WBC 3.2 (L) 12/11/2020   HGB 16.9 12/11/2020   HCT 48.0 12/11/2020   MCV 96 12/11/2020   PLT 145 (L) 12/11/2020   Lab Results  Component Value Date   ALT 32 12/11/2020   AST 24 12/11/2020   ALKPHOS 68 12/11/2020   BILITOT 0.5 12/11/2020     Review of Systems  Constitutional: Negative for fatigue and unexpected weight change.  HENT: Negative for nosebleeds.   Eyes: Negative for visual disturbance.  Respiratory: Negative for cough, chest tightness, shortness of breath and wheezing.   Cardiovascular: Negative for chest pain, palpitations and leg swelling.  Gastrointestinal: Negative for abdominal pain, constipation and diarrhea.  Neurological: Negative for dizziness, weakness, light-headedness and headaches.    Patient Active Problem List   Diagnosis Date Noted  . Spermatocele 12/11/2020  . Centrilobular emphysema (HCC) 09/18/2020  . Thoracic ascending aortic aneurysm (HCC) 09/18/2020  . Acquired thrombophilia  (HCC) 08/06/2020  . Aortic atherosclerosis (HCC) 10/12/2018  . Anticoagulated on warfarin 10/12/2018  . Personal history of tobacco use, presenting hazards to health 08/31/2018  . Benign colon polyp 01/10/2015  . Hypercholesteremia 01/10/2015  . Essential hypertension 01/10/2015  . History of pulmonary embolus (PE) 01/10/2015  . 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 Tobacco use disorder 01/10/2015    No Known Allergies  Past Surgical History:  Procedure Laterality Date  . COLONOSCOPY    . COLONOSCOPY WITH PROPOFOL N/A 01/27/2018   Procedure: COLONOSCOPY WITH PROPOFOL;  Surgeon: Christena Deem, MD;  Location: [facility];  Service: Endoscopy;  Laterality: N/A;  . VASCULAR SURGERY  2013   Leg  . VASECTOMY      Social History   Tobacco Use  . Smoking status: Current Every Day Smoker    Packs/day: 1.00    Years: 45.00    Pack years: 45.00    Types: Cigarettes  . Smokeless tobacco: Never Used  . Tobacco comment: currently .75ppd  Vaping Use  . Vaping Use: Never used  Substance Use Topics  . Alcohol use: Yes    Alcohol/week: 28.0 standard drinks    Types: 28 Cans of beer per week    Comment: 4 beers a day  . Drug use: No    Comment: Quit in 1997     Medication list has been reviewed and updated.  Current Meds  Medication Sig  . lisinopril-hydrochlorothiazide (ZESTORETIC) 20-25 MG tablet Take 1 tablet  by mouth once daily  . rosuvastatin (CRESTOR) 20 MG tablet Take 1 tablet (20 mg total) by mouth daily.  Marland Kitchen warfarin (COUMADIN) 7.5 MG tablet TAKE 1 TABLET BY MOUTH ONCE DAILY. AND TAKE AN EXTRA ONE-HALF TABLET ONCE A WEEK    PHQ 2/9 Scores 01/13/2021 12/11/2020 08/06/2020 10/17/2019  PHQ - 2 Score 0 0 0 0  PHQ- 9 Score 2 1 0 0    GAD 7 : Generalized Anxiety Score 01/13/2021 12/11/2020 08/06/2020  Nervous, Anxious, on Edge 0 0 0  Control/stop worrying 1 1 0  Worry too much - different things 1 0 0  Trouble relaxing 0 0 0  Restless 0 0 0  Easily annoyed or irritable 0 0 0   Afraid - awful might happen 0 0 0  Total GAD 7 Score 2 1 0    BP Readings from Last 3 Encounters:  01/13/21 (!) 148/96  12/11/20 (!) 162/88  08/06/20 134/82    Physical Exam Vitals and nursing note reviewed.  Constitutional:      General: He is not in acute distress.    Appearance: He is well-developed.  HENT:     Head: Normocephalic and atraumatic.  Cardiovascular:     Rate and Rhythm: Normal rate and regular rhythm.     Pulses: Normal pulses.     Heart sounds: No murmur heard.   Pulmonary:     Effort: Pulmonary effort is normal. No respiratory distress.  Musculoskeletal:     Right lower leg: No edema.     Left lower leg: No edema.  Skin:    General: Skin is warm and dry.     Findings: No rash.  Neurological:     Mental Status: He is alert and oriented to person, place, and time.  Psychiatric:        Mood and Affect: Mood normal.        Behavior: Behavior normal.     Wt Readings from Last 3 Encounters:  01/13/21 201 lb (91.2 kg)  12/11/20 201 lb (91.2 kg)  09/18/20 200 lb (90.7 kg)    BP (!) 148/96   Pulse 83   Temp 98 F (36.7 C) (Oral)   Resp 14   Ht 6' (1.829 m)   Wt 201 lb (91.2 kg)   SpO2 96%   BMI 27.26 kg/m   Assessment and Plan: 1. Essential hypertension Not controlled - pt instructed to double zestoretic. New Rx sent with correct instructions. Follow up in 6 weeks. - lisinopril-hydrochlorothiazide (ZESTORETIC) 20-25 MG tablet; Take 2 tablets by mouth daily.  Dispense: 180 tablet; Refill: 1  2. Thoracic ascending aortic aneurysm [facility] Dedicated CT showed this to be upper limit of normal with no aneurysm present   Partially dictated using Animal nutritionist. Any errors are unintentional.  Bari Edward, MD [facility]  01/13/2021

## 2021-01-13 NOTE — Patient Instructions (Signed)
Take 2 lisinopril/hct daily.

## 2021-01-13 NOTE — Telephone Encounter (Signed)
Requested Prescriptions  Pending Prescriptions Disp Refills  . lisinopril-hydrochlorothiazide (ZESTORETIC) 20-25 MG tablet [Pharmacy Med Name: Lisinopril-hydroCHLOROthiazide 20-25 MG Oral Tablet] 90 tablet 0    Sig: Take 1 tablet by mouth once daily     Cardiovascular:  ACEI + Diuretic Combos Failed - 01/11/2021  3:41 AM      Failed - Last BP in normal range    BP Readings from Last 1 Encounters:  12/11/20 (!) 162/88         Passed - Na in normal range and within 180 days    Sodium  Date Value Ref Range Status  12/11/2020 140 134 - 144 mmol/L Final         Passed - K in normal range and within 180 days    Potassium  Date Value Ref Range Status  12/11/2020 4.0 3.5 - 5.2 mmol/L Final         Passed - Cr in normal range and within 180 days    Creatinine, Ser  Date Value Ref Range Status  12/11/2020 0.96 0.76 - 1.27 mg/dL Final         Passed - Ca in normal range and within 180 days    Calcium  Date Value Ref Range Status  12/11/2020 9.3 8.6 - 10.2 mg/dL Final         Passed - Patient is not pregnant      Passed - Valid encounter within last 6 months    Recent Outpatient Visits          1 month ago Annual physical exam   [facility] Reubin Milan, MD   5 months ago Essential hypertension   [facility] Reubin Milan, MD   1 year ago Essential hypertension   [facility] Reubin Milan, MD   1 year ago Essential hypertension   [facility] Reubin Milan, MD   2 years ago Anticoagulated on warfarin   [facility] Reubin Milan, MD      Future Appointments            Today Reubin Milan, MD [facility], [facility]   In 11 months Judithann Graves Nyoka Cowden, MD [facility], [facility]

## 2021-02-26 ENCOUNTER — Encounter: Payer: Self-pay | Admitting: Internal Medicine

## 2021-02-26 ENCOUNTER — Other Ambulatory Visit: Payer: Self-pay

## 2021-02-26 ENCOUNTER — Ambulatory Visit (INDEPENDENT_AMBULATORY_CARE_PROVIDER_SITE_OTHER): Payer: Medicare HMO | Admitting: Internal Medicine

## 2021-02-26 DIAGNOSIS — R7303 Prediabetes: Secondary | ICD-10-CM | POA: Diagnosis not present

## 2021-02-26 DIAGNOSIS — I10 Essential (primary) hypertension: Secondary | ICD-10-CM

## 2021-02-26 MED ORDER — LISINOPRIL-HYDROCHLOROTHIAZIDE 20-25 MG PO TABS: 2.0000 | ORAL_TABLET | Freq: Every day | ORAL | 1 refills | Status: DC

## 2021-02-26 NOTE — Progress Notes (Signed)
Date:  02/26/2021   Name:  Tyler Estes   DOB:  [birth year]   MRN:  [id number]   Chief Complaint: Hypertension  Hypertension This is a chronic problem. The problem is controlled. Pertinent negatives include no chest pain, headaches, palpitations or shortness of breath. Past treatments include ACE inhibitors and diuretics (dose doubled last visit). The current treatment provides moderate improvement. There are no compliance problems.  There is no history of kidney disease, CAD/MI or CVA. There is no history of chronic renal disease.  Pre-diabetes - new on labs recently.  Reviewed treatment which is reduced carb diet and weight loss.   Lab Results  Component Value Date   CREATININE 0.96 12/11/2020   BUN 14 12/11/2020   NA 140 12/11/2020   K 4.0 12/11/2020   CL 104 12/11/2020   CO2 22 12/11/2020   Lab Results  Component Value Date   CHOL 186 12/11/2020   HDL 56 12/11/2020   LDLCALC 94 12/11/2020   TRIG 211 (H) 12/11/2020   CHOLHDL 3.3 12/11/2020   Lab Results  Component Value Date   TSH 1.40 09/25/2013   Lab Results  Component Value Date   HGBA1C 5.7 (H) 12/11/2020   Lab Results  Component Value Date   WBC 3.2 (L) 12/11/2020   HGB 16.9 12/11/2020   HCT 48.0 12/11/2020   MCV 96 12/11/2020   PLT 145 (L) 12/11/2020   Lab Results  Component Value Date   ALT 32 12/11/2020   AST 24 12/11/2020   ALKPHOS 68 12/11/2020   BILITOT 0.5 12/11/2020     Review of Systems  Constitutional:  Negative for fatigue and unexpected weight change.  HENT:  Negative for nosebleeds.   Eyes:  Negative for visual disturbance.  Respiratory:  Negative for cough, chest tightness, shortness of breath and wheezing.   Cardiovascular:  Negative for chest pain, palpitations and leg swelling.  Gastrointestinal:  Negative for abdominal pain, constipation and diarrhea.  Neurological:  Negative for dizziness, weakness, light-headedness and headaches.   Patient Active Problem List   Diagnosis  Date Noted   Prediabetes 02/26/2021   Spermatocele 12/11/2020   Centrilobular emphysema (HCC) 09/18/2020   Ascending aorta dilatation (HCC) 09/18/2020   Acquired thrombophilia (HCC) 08/06/2020   Aortic atherosclerosis (HCC) 10/12/2018   Anticoagulated on warfarin 10/12/2018   Personal history of tobacco use, presenting hazards to health 08/31/2018   Benign colon polyp 01/10/2015   Hypercholesteremia 01/10/2015   Essential hypertension 01/10/2015   History of pulmonary embolus (PE) 01/10/2015   Leg varices 01/10/2015   Tobacco use disorder 01/10/2015    No Known Allergies  Past Surgical History:  Procedure Laterality Date   COLONOSCOPY     COLONOSCOPY WITH PROPOFOL N/A 01/27/2018   Procedure: COLONOSCOPY WITH PROPOFOL;  Surgeon: Christena Deem, MD;  Location: [facility] ENDOSCOPY;  Service: Endoscopy;  Laterality: N/A;   VASCULAR SURGERY  2013   Leg   VASECTOMY      Social History   Tobacco Use   Smoking status: Every Day    Packs/day: 1.00    Years: 45.00    Pack years: 45.00    Types: Cigarettes   Smokeless tobacco: Never   Tobacco comments:    currently .75ppd  Vaping Use   Vaping Use: Never used  Substance Use Topics   Alcohol use: Yes    Alcohol/week: 28.0 standard drinks    Types: 28 Cans of beer per week    Comment: 4 beers a day  Drug use: No    Comment: Quit in 1997     Medication list has been reviewed and updated.  Current Meds  Medication Sig   rosuvastatin (CRESTOR) 20 MG tablet Take 1 tablet (20 mg total) by mouth daily.   warfarin (COUMADIN) 7.5 MG tablet TAKE 1 TABLET BY MOUTH ONCE DAILY. AND TAKE AN EXTRA ONE-HALF TABLET ONCE A WEEK   [DISCONTINUED] lisinopril-hydrochlorothiazide (ZESTORETIC) 20-25 MG tablet Take 2 tablets by mouth daily.    PHQ 2/9 Scores 02/26/2021 01/13/2021 12/11/2020 08/06/2020  PHQ - 2 Score 0 0 0 0  PHQ- 9 Score 0 2 1 0    GAD 7 : Generalized Anxiety Score 02/26/2021 01/13/2021 12/11/2020 08/06/2020  Nervous, Anxious,  on Edge 0 0 0 0  Control/stop worrying 0 1 1 0  Worry too much - different things 0 1 0 0  Trouble relaxing 0 0 0 0  Restless 0 0 0 0  Easily annoyed or irritable 0 0 0 0  Afraid - awful might happen 0 0 0 0  Total GAD 7 Score 0 2 1 0    BP Readings from Last 3 Encounters:  02/26/21 138/84  01/13/21 (!) 148/96  12/11/20 (!) 162/88    Physical Exam Vitals and nursing note reviewed.  Constitutional:      General: He is not in acute distress.    Appearance: He is well-developed.  HENT:     Head: Normocephalic and atraumatic.  Cardiovascular:     Rate and Rhythm: Normal rate and regular rhythm.     Pulses: Normal pulses.     Heart sounds: No murmur heard. Pulmonary:     Effort: Pulmonary effort is normal. No respiratory distress.     Breath sounds: No wheezing or rhonchi.  Musculoskeletal:     Cervical back: Normal range of motion.     Right lower leg: No edema.     Left lower leg: No edema.  Lymphadenopathy:     Cervical: No cervical adenopathy.  Skin:    General: Skin is warm and dry.     Capillary Refill: Capillary refill takes less than 2 seconds.     Findings: No rash.  Neurological:     Mental Status: He is alert and oriented to person, place, and time.  Psychiatric:        Mood and Affect: Mood normal.        Behavior: Behavior normal.    Wt Readings from Last 3 Encounters:  02/26/21 198 lb (89.8 kg)  01/13/21 201 lb (91.2 kg)  12/11/20 201 lb (91.2 kg)    BP 138/84   Pulse 80   Temp 97.7 F (36.5 C) (Oral)   Ht 6' (1.829 m)   Wt 198 lb (89.8 kg)   SpO2 97%   BMI 26.85 kg/m   Assessment and Plan: 1. Essential hypertension Clinically stable exam with well controlled BP. Tolerating medication change without side effects at this time. Pt to continue current regimen and low sodium diet; benefits of regular exercise as able discussed. - lisinopril-hydrochlorothiazide (ZESTORETIC) 20-25 MG tablet; Take 2 tablets by mouth daily.  Dispense: 180 tablet;  Refill: 1  2. Prediabetes Continue reduced carb diet and modest weight loss.   Partially dictated using Animal nutritionist. Any errors are unintentional.  Bari Edward, MD [facility]  02/26/2021

## 2021-03-19 DIAGNOSIS — Z0100 Encounter for examination of eyes and vision without abnormal findings: Secondary | ICD-10-CM | POA: Diagnosis not present

## 2021-03-19 DIAGNOSIS — H5203 Hypermetropia, bilateral: Secondary | ICD-10-CM | POA: Diagnosis not present

## 2021-04-30 DIAGNOSIS — Z20822 Contact with and (suspected) exposure to covid-19: Secondary | ICD-10-CM | POA: Diagnosis not present

## 2021-04-30 DIAGNOSIS — B342 Coronavirus infection, unspecified: Secondary | ICD-10-CM | POA: Diagnosis not present

## 2021-06-29 ENCOUNTER — Ambulatory Visit: Payer: Medicare HMO

## 2021-07-03 ENCOUNTER — Other Ambulatory Visit: Payer: Self-pay | Admitting: Internal Medicine

## 2021-07-03 DIAGNOSIS — E7800 Pure hypercholesterolemia, unspecified: Secondary | ICD-10-CM

## 2021-07-03 NOTE — Telephone Encounter (Signed)
Requested Prescriptions  Pending Prescriptions Disp Refills  . rosuvastatin (CRESTOR) 20 MG tablet [Pharmacy Med Name: Rosuvastatin Calcium 20 MG Oral Tablet] 90 tablet 1    Sig: Take 1 tablet by mouth once daily     Cardiovascular:  Antilipid - Statins Failed - 07/03/2021  2:11 PM      Failed - Triglycerides in normal range and within 360 days    Triglycerides  Date Value Ref Range Status  12/11/2020 211 (H) 0 - 149 mg/dL Final         Passed - Total Cholesterol in normal range and within 360 days    Cholesterol, Total  Date Value Ref Range Status  12/11/2020 186 100 - 199 mg/dL Final         Passed - LDL in normal range and within 360 days    LDL Chol Calc (NIH)  Date Value Ref Range Status  12/11/2020 94 0 - 99 mg/dL Final         Passed - HDL in normal range and within 360 days    HDL  Date Value Ref Range Status  12/11/2020 56 >39 mg/dL Final         Passed - Patient is not pregnant      Passed - Valid encounter within last 12 months    Recent Outpatient Visits          4 months ago Essential hypertension   [facility] Reubin Milan, MD   5 months ago Essential hypertension   [facility] Reubin Milan, MD   6 months ago Annual physical exam   [facility] Reubin Milan, MD   11 months ago Essential hypertension   [facility] Reubin Milan, MD   1 year ago Essential hypertension   [facility] Reubin Milan, MD      Future Appointments            In 3 weeks Judithann Graves Nyoka Cowden, MD [facility], [facility]   In 5 months Judithann Graves, Nyoka Cowden, MD [facility], [facility]

## 2021-07-08 ENCOUNTER — Ambulatory Visit: Payer: Medicare HMO

## 2021-07-20 ENCOUNTER — Telehealth: Payer: Self-pay | Admitting: Internal Medicine

## 2021-07-20 ENCOUNTER — Ambulatory Visit: Payer: Medicare HMO

## 2021-07-20 NOTE — Telephone Encounter (Signed)
Copied from CRM [id number]. Topic: Medicare AWV >> Jul 20, 2021  8:46 AM Claudette Laws R wrote: Reason for CRM:  12/5 Left message  to change appt time to 10:40 to do by  phone today -if unable to do this am AWV will need to be reschedule to another day -srs

## 2021-07-30 ENCOUNTER — Ambulatory Visit (INDEPENDENT_AMBULATORY_CARE_PROVIDER_SITE_OTHER): Payer: Medicare HMO | Admitting: Internal Medicine

## 2021-07-30 ENCOUNTER — Encounter: Payer: Self-pay | Admitting: Internal Medicine

## 2021-07-30 ENCOUNTER — Other Ambulatory Visit: Payer: Self-pay

## 2021-07-30 VITALS — BP 128/76 | HR 76 | Ht 72.0 in | Wt 201.8 lb

## 2021-07-30 DIAGNOSIS — I10 Essential (primary) hypertension: Secondary | ICD-10-CM | POA: Diagnosis not present

## 2021-07-30 DIAGNOSIS — Z86711 Personal history of pulmonary embolism: Secondary | ICD-10-CM

## 2021-07-30 DIAGNOSIS — D6869 Other thrombophilia: Secondary | ICD-10-CM | POA: Diagnosis not present

## 2021-07-30 DIAGNOSIS — R7303 Prediabetes: Secondary | ICD-10-CM

## 2021-07-30 MED ORDER — LISINOPRIL-HYDROCHLOROTHIAZIDE 20-25 MG PO TABS: 2.0000 | ORAL_TABLET | Freq: Every day | ORAL | 1 refills | Status: DC

## 2021-07-30 NOTE — Progress Notes (Signed)
Date:  07/30/2021   Name:  Tyler Estes   DOB:  08/22/1955   MRN:  [id number]   Chief Complaint: Hypertension  Hypertension This is a chronic problem. The current episode started more than 1 year ago. The problem is unchanged. The problem is controlled. Pertinent negatives include no anxiety, chest pain, headaches, palpitations or shortness of breath. Past treatments include ACE inhibitors and diuretics. The current treatment provides significant improvement. There are no compliance problems.   Diabetes He presents for his follow-up diabetic visit. Diabetes type: prediabetes. His disease course has been stable. Pertinent negatives for hypoglycemia include no dizziness, headaches or nervousness/anxiousness. Pertinent negatives for diabetes include no chest pain, no fatigue and no weakness. Current diabetic treatment includes diet. He is compliant with treatment most of the time.  Hyperlipidemia This is a chronic problem. The problem is controlled. Pertinent negatives include no chest pain or shortness of breath. Current antihyperlipidemic treatment includes statins (Crestor).   Lab Results  Component Value Date   NA 140 12/11/2020   K 4.0 12/11/2020   CO2 22 12/11/2020   GLUCOSE 108 (H) 12/11/2020   BUN 14 12/11/2020   CREATININE 0.96 12/11/2020   CALCIUM 9.3 12/11/2020   EGFR 88 12/11/2020   GFRNONAA 71 10/17/2019   Lab Results  Component Value Date   CHOL 186 12/11/2020   HDL 56 12/11/2020   LDLCALC 94 12/11/2020   TRIG 211 (H) 12/11/2020   CHOLHDL 3.3 12/11/2020   Lab Results  Component Value Date   TSH 1.40 09/25/2013   Lab Results  Component Value Date   HGBA1C 5.7 (H) 12/11/2020   Lab Results  Component Value Date   WBC 3.2 (L) 12/11/2020   HGB 16.9 12/11/2020   HCT 48.0 12/11/2020   MCV 96 12/11/2020   PLT 145 (L) 12/11/2020   Lab Results  Component Value Date   ALT 32 12/11/2020   AST 24 12/11/2020   ALKPHOS 68 12/11/2020   BILITOT 0.5 12/11/2020    No results found for: 25OHVITD2, 25OHVITD3, VD25OH   Review of Systems  Constitutional:  Negative for appetite change, fatigue and unexpected weight change.  HENT:  Negative for nosebleeds.   Eyes:  Negative for visual disturbance.  Respiratory:  Negative for cough, chest tightness, shortness of breath and wheezing.   Cardiovascular:  Negative for chest pain, palpitations and leg swelling.  Gastrointestinal:  Negative for abdominal pain, blood in stool, constipation and diarrhea.  Genitourinary:  Negative for hematuria.  Neurological:  Negative for dizziness, weakness, light-headedness and headaches.  Hematological:  Does not bruise/bleed easily.  Psychiatric/Behavioral:  Negative for dysphoric mood and sleep disturbance. The patient is not nervous/anxious.    Patient Active Problem List   Diagnosis Date Noted   Prediabetes 02/26/2021   Spermatocele 12/11/2020   Centrilobular emphysema ([place]) 09/18/2020   Ascending aorta dilatation ([place]) 09/18/2020   Acquired thrombophilia ([place]) 08/06/2020   Aortic atherosclerosis ([place]) 10/12/2018   Anticoagulated on warfarin 10/12/2018   Personal history of tobacco use, presenting hazards to health 08/31/2018   Benign colon polyp 01/10/2015   Hypercholesteremia 01/10/2015   Essential hypertension 01/10/2015   History of pulmonary embolus (PE) 01/10/2015   Leg varices 01/10/2015   Tobacco use disorder 01/10/2015    No Known Allergies  Past Surgical History:  Procedure Laterality Date   COLONOSCOPY     COLONOSCOPY WITH PROPOFOL N/A 01/27/2018   Procedure: COLONOSCOPY WITH PROPOFOL;  Surgeon: Lollie Sails, MD;  [facility] END[facility];  Service:  Endoscopy;  Laterality: N/A;   VASCULAR SURGERY  2013   Leg   VASECTOMY      Social History   Tobacco Use   Smoking status: Every Day    Packs/day: 1.00    Years: 45.00    Pack years: 45.00    Types: Cigarettes   Smokeless tobacco: Never   Tobacco comments:    currently .75ppd   Vaping Use   Vaping Use: Never used  Substance Use Topics   Alcohol use: Yes    Alcohol/week: 28.0 standard drinks    Types: 28 Cans of beer per week    Comment: 4 beers a day   Drug use: No    Comment: Quit in 1997     Medication list has been reviewed and updated.  Current Meds  Medication Sig   lisinopril-hydrochlorothiazide (ZESTORETIC) 20-25 MG tablet Take 2 tablets by mouth daily.   rosuvastatin (CRESTOR) 20 MG tablet Take 1 tablet by mouth once daily   warfarin (COUMADIN) 7.5 MG tablet TAKE 1 TABLET BY MOUTH ONCE DAILY. AND TAKE AN EXTRA ONE-HALF TABLET ONCE A WEEK    PHQ 2/9 Scores 07/30/2021 02/26/2021 01/13/2021 12/11/2020  PHQ - 2 Score 0 0 0 0  PHQ- 9 Score 0 0 2 1    GAD 7 : Generalized Anxiety Score 07/30/2021 02/26/2021 01/13/2021 12/11/2020  Nervous, Anxious, on Edge 0 0 0 0  Control/stop worrying 0 0 1 1  Worry too much - different things 0 0 1 0  Trouble relaxing 0 0 0 0  Restless 0 0 0 0  Easily annoyed or irritable 0 0 0 0  Afraid - awful might happen 0 0 0 0  Total GAD 7 Score 0 0 2 1  Anxiety Difficulty Not difficult at all - - -    BP Readings from Last 3 Encounters:  07/30/21 128/76  02/26/21 138/84  01/13/21 (!) 148/96    Physical Exam Vitals and nursing note reviewed.  Constitutional:      General: He is not in acute distress.    Appearance: He is well-developed.  HENT:     Head: Normocephalic and atraumatic.  Cardiovascular:     Rate and Rhythm: Normal rate and regular rhythm.     Pulses: Normal pulses.     Heart sounds: No murmur heard. Pulmonary:     Effort: Pulmonary effort is normal. No respiratory distress.  Musculoskeletal:     Cervical back: Normal range of motion.     Right lower leg: No edema.     Left lower leg: No edema.  Lymphadenopathy:     Cervical: No cervical adenopathy.  Skin:    General: Skin is warm and dry.     Capillary Refill: Capillary refill takes less than 2 seconds.     Findings: No rash.   Neurological:     General: No focal deficit present.     Mental Status: He is alert and oriented to person, place, and time.  Psychiatric:        Mood and Affect: Mood normal.        Behavior: Behavior normal.    Wt Readings from Last 3 Encounters:  07/30/21 201 lb 12.8 oz (91.5 kg)  02/26/21 198 lb (89.8 kg)  01/13/21 201 lb (91.2 kg)    BP 128/76 (BP Location: Right Arm, Cuff Size: Large)    Pulse 76    Ht 6' (1.829 m)    Wt 201 lb 12.8 oz (91.5 kg)  SpO2 95%    BMI 27.37 kg/m   Assessment and Plan: 1. Essential hypertension Clinically stable exam with well controlled BP. Tolerating medications without side effects at this time. Pt to continue current regimen and low sodium diet; benefits of regular exercise as able discussed. - lisinopril-hydrochlorothiazide (ZESTORETIC) 20-25 MG tablet; Take 2 tablets by mouth daily.  Dispense: 180 tablet; Refill: 1 - Basic metabolic panel  2. Prediabetes Continue low carb diet  - Hemoglobin A1c  3. Acquired thrombophilia ([place]) On Coumadin indefinitely due to PE No significant bleeding issues noted - Protime-INR  4. History of pulmonary embolus (PE) Continue Coumadin   Partially dictated using Editor, commissioning. Any errors are unintentional.  Halina Maidens, MD [facility]  07/30/2021

## 2021-07-31 LAB — BASIC METABOLIC PANEL
BUN/Creatinine Ratio: 19 (ref 10–24)
BUN: 22 mg/dL (ref 8–27)
CO2: 23 mmol/L (ref 20–29)
Calcium: 9.7 mg/dL (ref 8.6–10.2)
Chloride: 105 mmol/L (ref 96–106)
Creatinine, Ser: 1.13 mg/dL (ref 0.76–1.27)
Glucose: 99 mg/dL (ref 70–99)
Potassium: 4.4 mmol/L (ref 3.5–5.2)
Sodium: 143 mmol/L (ref 134–144)
eGFR: 72 mL/min/{1.73_m2} (ref >59)

## 2021-07-31 LAB — HEMOGLOBIN A1C
Est. average glucose Bld gHb Est-mCnc: 114 mg/dL
Hgb A1c MFr Bld: 5.6 % (ref 4.8–5.6)

## 2021-07-31 LAB — PROTIME-INR
INR: 2.3 — ABNORMAL HIGH (ref 0.9–1.2)
Prothrombin Time: 23.6 s — ABNORMAL HIGH (ref 9.1–12.0)

## 2021-08-05 ENCOUNTER — Ambulatory Visit: Payer: Medicare HMO

## 2021-08-12 ENCOUNTER — Ambulatory Visit (INDEPENDENT_AMBULATORY_CARE_PROVIDER_SITE_OTHER): Payer: Medicare HMO

## 2021-08-12 DIAGNOSIS — Z0000 Encounter for general adult medical examination without abnormal findings: Secondary | ICD-10-CM | POA: Diagnosis not present

## 2021-08-12 DIAGNOSIS — Z122 Encounter for screening for malignant neoplasm of respiratory organs: Secondary | ICD-10-CM | POA: Diagnosis not present

## 2021-08-12 NOTE — Patient Instructions (Signed)
[name] , Thank you for taking time to come for your Medicare Wellness Visit. I appreciate your ongoing commitment to your health goals. Please review the following plan we discussed and let me know if I can assist you in the future.   Screening recommendations/referrals: Colonoscopy: done 01/27/18. Repeat 01/2028 Recommended yearly ophthalmology/optometry visit for glaucoma screening and checkup Recommended yearly dental visit for hygiene and checkup  Vaccinations: Influenza vaccine: declined Pneumococcal vaccine: done 08/06/20 Tdap vaccine: done 01/28/14 Shingles vaccine: Shingrix discussed. Please contact your pharmacy for coverage information.  Covid-19: done 11/30/19 & 12/27/19  Advanced directives: Advance directive discussed with you today. I have provided a copy for you to complete at home and have notarized. Once this is complete please bring a copy in to our office so we can scan it into your chart.   Conditions/risks identified: If you wish to quit smoking, help is available. For free tobacco cessation program offerings call the [facility] at 970-872-2228 or Live Well Line at 432-023-5382. You may also visit [email or web address] or email [email or web address] for more information on other programs.   Next appointment: Follow up in one year for your annual wellness visit.   Preventive Care 66 Years and Older, Male Preventive care refers to lifestyle choices and visits with your health care provider that can promote health and wellness. What does preventive care include? A yearly physical exam. This is also called an annual well check. Dental exams once or twice a year. Routine eye exams. Ask your health care provider how often you should have your eyes checked. Personal lifestyle choices, including: Daily care of your teeth and gums. Regular physical activity. Eating a healthy diet. Avoiding tobacco and drug use. Limiting alcohol use. Practicing safe  sex. Taking low doses of aspirin every day. Taking vitamin and mineral supplements as recommended by your health care provider. What happens during an annual well check? The services and screenings done by your health care provider during your annual well check will depend on your age, overall health, lifestyle risk factors, and family history of disease. Counseling  Your health care provider may ask you questions about your: Alcohol use. Tobacco use. Drug use. Emotional well-being. Home and relationship well-being. Sexual activity. Eating habits. History of falls. Memory and ability to understand (cognition). Work and work Astronomer. Screening  You may have the following tests or measurements: Height, weight, and BMI. Blood pressure. Lipid and cholesterol levels. These may be checked every 5 years, or more frequently if you are over 33 years old. Skin check. Lung cancer screening. You may have this screening every year starting at age 22 if you have a 30-pack-year history of smoking and currently smoke or have quit within the past 15 years. Fecal occult blood test (FOBT) of the stool. You may have this test every year starting at age 8. Flexible sigmoidoscopy or colonoscopy. You may have a sigmoidoscopy every 5 years or a colonoscopy every 10 years starting at age 40. Prostate cancer screening. Recommendations will vary depending on your family history and other risks. Hepatitis C blood test. Hepatitis B blood test. Sexually transmitted disease (STD) testing. Diabetes screening. This is done by checking your blood sugar (glucose) after you have not eaten for a while (fasting). You may have this done every 1-3 years. Abdominal aortic aneurysm (AAA) screening. You may need this if you are a current or former smoker. Osteoporosis. You may be screened starting at age 42 if you are at high risk.  Talk with your health care provider about your test results, treatment options, and if  necessary, the need for more tests. Vaccines  Your health care provider may recommend certain vaccines, such as: Influenza vaccine. This is recommended every year. Tetanus, diphtheria, and acellular pertussis (Tdap, Td) vaccine. You may need a Td booster every 10 years. Zoster vaccine. You may need this after age 6. Pneumococcal 13-valent conjugate (PCV13) vaccine. One dose is recommended after age 66. Pneumococcal polysaccharide (PPSV23) vaccine. One dose is recommended after age 65. Talk to your health care provider about which screenings and vaccines you need and how often you need them. This information is not intended to replace advice given to you by your health care provider. Make sure you discuss any questions you have with your health care provider. Document Released: 08/29/2015 Document Revised: 04/21/2016 Document Reviewed: 06/03/2015 Elsevier Interactive Patient Education  2017 [place] Prevention in the Home Falls can cause injuries. They can happen to people of all ages. There are many things you can do to make your home safe and to help prevent falls. What can I do on the outside of my home? Regularly fix the edges of walkways and driveways and fix any cracks. Remove anything that might make you trip as you walk through a door, such as a raised step or threshold. Trim any bushes or trees on the path to your home. Use bright outdoor lighting. Clear any walking paths of anything that might make someone trip, such as rocks or tools. Regularly check to see if handrails are loose or broken. Make sure that both sides of any steps have handrails. Any raised decks and porches should have guardrails on the edges. Have any leaves, snow, or ice cleared regularly. Use sand or salt on walking paths during winter. Clean up any spills in your garage right away. This includes oil or grease spills. What can I do in the bathroom? Use night lights. Install grab bars by the toilet  and in the tub and shower. Do not use towel bars as grab bars. Use non-skid mats or decals in the tub or shower. If you need to sit down in the shower, use a plastic, non-slip stool. Keep the floor dry. Clean up any water that spills on the floor as soon as it happens. Remove soap buildup in the tub or shower regularly. Attach bath mats securely with double-sided non-slip rug tape. Do not have throw rugs and other things on the floor that can make you trip. What can I do in the bedroom? Use night lights. Make sure that you have a light by your bed that is easy to reach. Do not use any sheets or blankets that are too big for your bed. They should not hang down onto the floor. Have a firm chair that has side arms. You can use this for support while you get dressed. Do not have throw rugs and other things on the floor that can make you trip. What can I do in the kitchen? Clean up any spills right away. Avoid walking on wet floors. Keep items that you use a lot in easy-to-reach places. If you need to reach something above you, use a strong step stool that has a grab bar. Keep electrical cords out of the way. Do not use floor polish or wax that makes floors slippery. If you must use wax, use non-skid floor wax. Do not have throw rugs and other things on the floor that can make  you trip. What can I do with my stairs? Do not leave any items on the stairs. Make sure that there are handrails on both sides of the stairs and use them. Fix handrails that are broken or loose. Make sure that handrails are as long as the stairways. Check any carpeting to make sure that it is firmly attached to the stairs. Fix any carpet that is loose or worn. Avoid having throw rugs at the top or bottom of the stairs. If you do have throw rugs, attach them to the floor with carpet tape. Make sure that you have a light switch at the top of the stairs and the bottom of the stairs. If you do not have them, ask someone to add  them for you. What else can I do to help prevent falls? Wear shoes that: Do not have high heels. Have rubber bottoms. Are comfortable and fit you well. Are closed at the toe. Do not wear sandals. If you use a stepladder: Make sure that it is fully opened. Do not climb a closed stepladder. Make sure that both sides of the stepladder are locked into place. Ask someone to hold it for you, if possible. Clearly mark and make sure that you can see: Any grab bars or handrails. First and last steps. Where the edge of each step is. Use tools that help you move around (mobility aids) if they are needed. These include: Canes. Walkers. Scooters. Crutches. Turn on the lights when you go into a dark area. Replace any light bulbs as soon as they burn out. Set up your furniture so you have a clear path. Avoid moving your furniture around. If any of your floors are uneven, fix them. If there are any pets around you, be aware of where they are. Review your medicines with your doctor. Some medicines can make you feel dizzy. This can increase your chance of falling. Ask your doctor what other things that you can do to help prevent falls. This information is not intended to replace advice given to you by your health care provider. Make sure you discuss any questions you have with your health care provider. Document Released: 05/29/2009 Document Revised: 01/08/2016 Document Reviewed: 09/06/2014 Elsevier Interactive Patient Education  2017 [employer].

## 2021-08-12 NOTE — Progress Notes (Signed)
Subjective:   Tyler Estes is a 65 y.o. male who presents for an Initial Medicare Annual Wellness Visit.  Virtual Visit via Telephone Note  I connected with  Tyler Estes on 08/12/21 at  3:20 PM EST by telephone and verified that I am speaking with the correct person using two identifiers.  Location: Patient: home Provider: [facility] Persons participating in the virtual visit: patient/Nurse Health Advisor   I discussed the limitations, risks, security and privacy concerns of performing an evaluation and management service by telephone and the availability of in person appointments. The patient expressed understanding and agreed to proceed.  Interactive audio and video telecommunications were attempted between this nurse and patient, however failed, due to patient having technical difficulties OR patient did not have access to video capability.  We continued and completed visit with audio only.  Some vital signs may be absent or patient reported.   Reather Littler, LPN   Review of Systems     Cardiac Risk Factors include: advanced age (>43men, >45 women);dyslipidemia;male gender;hypertension;smoking/ tobacco exposure     Objective:    There were no vitals filed for this visit. There is no height or weight on file to calculate BMI.  Advanced Directives 08/12/2021 01/27/2018 03/02/2016 12/31/2015 09/30/2015  Does Patient Have a Medical Advance Directive? No No No No No  Would patient like information on creating a medical advance directive? Yes (MAU/Ambulatory/Procedural Areas - Information given) - - - -    Current Medications (verified) Outpatient Encounter Medications as of 08/12/2021  Medication Sig   lisinopril-hydrochlorothiazide (ZESTORETIC) 20-25 MG tablet Take 2 tablets by mouth daily.   rosuvastatin (CRESTOR) 20 MG tablet Take 1 tablet by mouth once daily   warfarin (COUMADIN) 7.5 MG tablet TAKE 1 TABLET BY MOUTH ONCE DAILY. AND TAKE AN EXTRA ONE-HALF TABLET ONCE A WEEK  (Patient taking differently: 7.5 mg. TAKE 1/2 TABLET BY MOUTH ON MONDAYs and ONE WHOLE TABLET DAILY THE REST OF THE WEEK)   No facility-administered encounter medications on file as of 08/12/2021.    Allergies (verified) Patient has no known allergies.   History: Past Medical History:  Diagnosis Date   High cholesterol    Hx MRSA infection 01/10/2015   Hx pulmonary embolism    Hypertension    Past Surgical History:  Procedure Laterality Date   COLONOSCOPY     COLONOSCOPY WITH PROPOFOL N/A 01/27/2018   Procedure: COLONOSCOPY WITH PROPOFOL;  Surgeon: Christena Deem, MD;  [facility] ENDOSCOPY;  Service: Endoscopy;  Laterality: N/A;   VASCULAR SURGERY  2013   Leg   VASECTOMY     Family History  Problem Relation Age of Onset   Hyperlipidemia Mother    Heart disease Father    Mesothelioma Father    Dementia Sister    Social History   Socioeconomic History   Marital status: Married    Spouse name: Tyler Estes   Number of children: 2   Years of education: HS   Highest education level: Not on file  Occupational History   Occupation: Merchandiser, retail at [employer]    Comment: [employer]  Tobacco Use   Smoking status: Every Day    Packs/day: 1.00    Years: 45.00    Pack years: 45.00    Types: Cigarettes   Smokeless tobacco: Never   Tobacco comments:    currently .75ppd  Vaping Use   Vaping Use: Never used  Substance and Sexual Activity   Alcohol use: Yes    Alcohol/week: 28.0 standard drinks  Types: 28 Cans of beer per week    Comment: 4 beers a day   Drug use: No    Comment: Quit in 1997   Sexual activity: Yes  Other Topics Concern   Not on file  Social History Narrative   Not on file   Social Determinants of Health   Financial Resource Strain: Low Risk    Difficulty of Paying Living Expenses: Not hard at all  Food Insecurity: No Food Insecurity   Worried About Programme researcher, broadcasting/film/video in the Last Year: Never true   Ran Out of Food in the Last Year: Never true   Transportation Needs: No Transportation Needs   Lack of Transportation (Medical): No   Lack of Transportation (Non-Medical): No  Physical Activity: Inactive   Days of Exercise per Week: 0 days   Minutes of Exercise per Session: 0 min  Stress: No Stress Concern Present   Feeling of Stress : Not at all  Social Connections: Moderately Isolated   Frequency of Communication with Friends and Family: More than three times a week   Frequency of Social Gatherings with Friends and Family: More than three times a week   Attends Religious Services: Never   Database administrator or Organizations: No   Attends Banker Meetings: Never   Marital Status: Married    Tobacco Counseling Ready to quit: Yes Counseling given: Yes Tobacco comments: currently .75ppd   Clinical Intake:  Pre-visit preparation completed: Yes  Pain : No/denies pain     Nutritional Risks: Nausea/ vomitting/ diarrhea (stomach bug the past 24 hours) Diabetes: No  How often do you need to have someone help you when you read instructions, pamphlets, or other written materials from your doctor or pharmacy?: 1 - Never    Interpreter Needed?: No  Information entered by :: Reather Littler LPN   Activities of Daily Living In your present state of health, do you have any difficulty performing the following activities: 08/12/2021 07/30/2021  Hearing? Tyler Estes  Vision? N N  Difficulty concentrating or making decisions? N N  Walking or climbing stairs? N N  Dressing or bathing? N N  Doing errands, shopping? N N  Preparing Food and eating ? N -  Using the Toilet? N -  In the past six months, have you accidently leaked urine? N -  Do you have problems with loss of bowel control? N -  Managing your Medications? N -  Managing your Finances? N -  Housekeeping or managing your Housekeeping? N -  Some recent data might be hidden    Patient Care Team: Reubin Milan, MD as PCP - General (Family Medicine) Oh,  Ezzard Standing, MD (Inactive) (Gastroenterology)  Indicate any recent Medical Services you may have received from other than [facility] providers in the past year (date may be approximate).     Assessment:   This is a routine wellness examination for Kymere.  Hearing/Vision screen Hearing Screening - Comments:: Pt c/o mild hearing difficulty; declines hearing aids due to cost  Vision Screening - Comments:: Annual visions screenings done at [facility] in [place]  Dietary issues and exercise activities discussed: Current Exercise Habits: The patient does not participate in regular exercise at present, Exercise limited by: None identified   Goals Addressed             This Visit's Progress    Patient Stated       Pt states he would like to remain healthy and active  Depression Screen PHQ 2/9 Scores 08/12/2021 07/30/2021 02/26/2021 01/13/2021 12/11/2020 08/06/2020 10/17/2019  PHQ - 2 Score 0 0 0 0 0 0 0  PHQ- 9 Score - 0 0 2 1 0 0    Fall Risk Fall Risk  08/12/2021 07/30/2021 02/26/2021 01/13/2021 12/11/2020  Falls in the past year? 0 0 0 0 0  Number falls in past yr: 0 0 0 - -  Injury with Fall? 0 0 0 - -  Risk for fall due to : No Fall Risks No Fall Risks No Fall Risks - -  Follow up Falls prevention discussed Falls evaluation completed Falls evaluation completed Falls evaluation completed Falls evaluation completed    FALL RISK PREVENTION PERTAINING TO THE HOME:  Any stairs in or around the home? Yes  If so, are there any without handrails? No  Home free of loose throw rugs in walkways, pet beds, electrical cords, etc? Yes  Adequate lighting in your home to reduce risk of falls? Yes   ASSISTIVE DEVICES UTILIZED TO PREVENT FALLS:  Life alert? No  Use of a cane, walker or w/c? No  Grab bars in the bathroom? No  Shower chair or bench in shower? No  Elevated toilet seat or a handicapped toilet? No   TIMED UP AND GO:  Was the test performed? No . Telephonic  visit  Cognitive Function: Normal cognitive status assessed by direct observation by this Nurse Health Advisor. No abnormalities found.          Immunizations Immunization History  Administered Date(s) Administered   [employer] Vaccination 11/30/2019, 12/27/2019   Pneumococcal Conjugate-13 08/06/2020   Pneumococcal Polysaccharide-23 03/02/2016   Tdap 01/28/2014    TDAP status: Up to date  Flu Vaccine status: Declined, Education has been provided regarding the importance of this vaccine but patient still declined. Advised may receive this vaccine at local pharmacy or Health Dept. Aware to provide a copy of the vaccination record if obtained from local pharmacy or Health Dept. Verbalized acceptance and understanding.  Pneumococcal vaccine status: Up to date  Covid-19 vaccine status: Completed vaccines  Qualifies for Shingles Vaccine? Yes   Zostavax completed No   Shingrix Completed?: No.    Education has been provided regarding the importance of this vaccine. Patient has been advised to call insurance company to determine out of pocket expense if they have not yet received this vaccine. Advised may also receive vaccine at local pharmacy or Health Dept. Verbalized acceptance and understanding.  Screening Tests Health Maintenance  Topic Date Due   Zoster Vaccines- Shingrix (1 of 2) Never done   COVID-19 Vaccine (3 - Booster for Moderna series) 02/21/2020   Pneumonia Vaccine 44+ Years old (3 - PPSV23 if available, else PCV20) 08/06/2021   INFLUENZA VACCINE  11/13/2021 (Originally 03/16/2021)   TETANUS/TDAP  01/29/2024   COLONOSCOPY (Pts 45-75yrs Insurance coverage will need to be confirmed)  01/28/2028   Hepatitis C Screening  Completed   HPV VACCINES  Aged Out    Health Maintenance  Health Maintenance Due  Topic Date Due   Zoster Vaccines- Shingrix (1 of 2) Never done   COVID-19 Vaccine (3 - Booster for Moderna series) 02/21/2020   Pneumonia Vaccine 51+ Years old  (3 - PPSV23 if available, else PCV20) 08/06/2021    Colorectal cancer screening: Type of screening: Colonoscopy. Completed 01/27/18. Repeat every 10 years  Lung Cancer Screening: (Low Dose CT Chest recommended if Age 85-80 years, 30 pack-year currently smoking OR have quit w/in 15years.) does qualify.  Completed 09/18/20  Lung Cancer Screening Referral: A referral has been sent to [facility] Pulmonary Lung Cancer Screening regarding the possible need for this exam. The patient's chart will be reviewed to determine if they qualify and the patient will be contacted to facilitate the scheduling of the Low Dose Chest CT for lung cancer screening.    Additional Screening:  Hepatitis C Screening: does qualify; Completed 10/12/17  Vision Screening: Recommended annual ophthalmology exams for early detection of glaucoma and other disorders of the eye. Is the patient up to date with their annual eye exam?  Yes  Who is the provider or what is the name of the office in which the patient attends annual eye exams? [facility].   Dental Screening: Recommended annual dental exams for proper oral hygiene  Community Resource Referral / Chronic Care Management: CRR required this visit?  No   CCM required this visit?  No      Plan:     I have personally reviewed and noted the following in the patients chart:   Medical and social history Use of alcohol, tobacco or illicit drugs  Current medications and supplements including opioid prescriptions. Patient is not currently taking opioid prescriptions. Functional ability and status Nutritional status Physical activity Advanced directives List of other physicians Hospitalizations, surgeries, and ER visits in previous 12 months Vitals Screenings to include cognitive, depression, and falls Referrals and appointments  In addition, I have reviewed and discussed with patient certain preventive protocols, quality metrics, and best practice recommendations.  A written personalized care plan for preventive services as well as general preventive health recommendations were provided to patient.     Reather Littler, LPN   [id number]   Nurse Notes: none

## 2021-09-29 ENCOUNTER — Other Ambulatory Visit: Payer: Self-pay

## 2021-09-29 DIAGNOSIS — F17210 Nicotine dependence, cigarettes, uncomplicated: Secondary | ICD-10-CM

## 2021-09-29 DIAGNOSIS — Z87891 Personal history of nicotine dependence: Secondary | ICD-10-CM

## 2021-10-13 ENCOUNTER — Other Ambulatory Visit: Payer: Self-pay

## 2021-10-13 ENCOUNTER — Ambulatory Visit
Admission: RE | Admit: 2021-10-13 | Discharge: 2021-10-13 | Disposition: A | Discharge status: Home / Self Care | Payer: Medicare HMO | Source: Ambulatory Visit | Attending: Acute Care | Admitting: Acute Care

## 2021-10-13 DIAGNOSIS — F17210 Nicotine dependence, cigarettes, uncomplicated: Secondary | ICD-10-CM | POA: Diagnosis not present

## 2021-10-13 DIAGNOSIS — Z87891 Personal history of nicotine dependence: Secondary | ICD-10-CM | POA: Insufficient documentation

## 2021-10-15 ENCOUNTER — Other Ambulatory Visit: Payer: Self-pay | Admitting: Acute Care

## 2021-10-15 DIAGNOSIS — F17210 Nicotine dependence, cigarettes, uncomplicated: Secondary | ICD-10-CM

## 2021-10-15 DIAGNOSIS — Z87891 Personal history of nicotine dependence: Secondary | ICD-10-CM

## 2021-11-09 ENCOUNTER — Other Ambulatory Visit: Payer: Self-pay

## 2021-11-09 DIAGNOSIS — E7800 Pure hypercholesterolemia, unspecified: Secondary | ICD-10-CM

## 2021-11-09 MED ORDER — ROSUVASTATIN CALCIUM 20 MG PO TABS: 20.0000 mg | ORAL_TABLET | Freq: Every day | ORAL | 1 refills | Status: DC

## 2021-12-03 ENCOUNTER — Encounter: Payer: Self-pay | Admitting: Internal Medicine

## 2021-12-03 ENCOUNTER — Ambulatory Visit (INDEPENDENT_AMBULATORY_CARE_PROVIDER_SITE_OTHER): Payer: Medicare HMO | Admitting: Internal Medicine

## 2021-12-03 VITALS — BP 130/80 | HR 77 | Ht 72.0 in | Wt 199.0 lb

## 2021-12-03 DIAGNOSIS — F17200 Nicotine dependence, unspecified, uncomplicated: Secondary | ICD-10-CM

## 2021-12-03 DIAGNOSIS — I10 Essential (primary) hypertension: Secondary | ICD-10-CM

## 2021-12-03 DIAGNOSIS — D6869 Other thrombophilia: Secondary | ICD-10-CM

## 2021-12-03 DIAGNOSIS — Z7901 Long term (current) use of anticoagulants: Secondary | ICD-10-CM

## 2021-12-03 DIAGNOSIS — J432 Centrilobular emphysema: Secondary | ICD-10-CM | POA: Diagnosis not present

## 2021-12-03 DIAGNOSIS — Z0000 Encounter for general adult medical examination without abnormal findings: Secondary | ICD-10-CM | POA: Diagnosis not present

## 2021-12-03 DIAGNOSIS — E7800 Pure hypercholesterolemia, unspecified: Secondary | ICD-10-CM | POA: Diagnosis not present

## 2021-12-03 DIAGNOSIS — R7303 Prediabetes: Secondary | ICD-10-CM | POA: Diagnosis not present

## 2021-12-03 DIAGNOSIS — Z125 Encounter for screening for malignant neoplasm of prostate: Secondary | ICD-10-CM

## 2021-12-03 DIAGNOSIS — I77810 Thoracic aortic ectasia: Secondary | ICD-10-CM

## 2021-12-03 LAB — POCT URINALYSIS DIPSTICK
Bilirubin, UA: NEGATIVE
Blood, UA: NEGATIVE
Glucose, UA: NEGATIVE
Ketones, UA: NEGATIVE
Leukocytes, UA: NEGATIVE
Nitrite, UA: NEGATIVE
Protein, UA: NEGATIVE
Spec Grav, UA: >=1.03 — AB (ref 1.010–1.025)
Urobilinogen, UA: 0.2 E.U./dL
pH, UA: 5 (ref 5.0–8.0)

## 2021-12-03 MED ORDER — LISINOPRIL-HYDROCHLOROTHIAZIDE 20-25 MG PO TABS: 2.0000 | ORAL_TABLET | Freq: Every day | ORAL | 1 refills | Status: DC

## 2021-12-03 MED ORDER — WARFARIN SODIUM 7.5 MG PO TABS: ORAL_TABLET | ORAL | 3 refills | Status: DC

## 2021-12-03 NOTE — Progress Notes (Signed)
? ? ?Date:  12/03/2021  ? ?Name:  Tyler Estes   DOB:  10/07/1955   MRN:  [id number] ? ? ?Chief Complaint: Annual Exam ?Tyler Estes is a 66 y.o. male who presents today for his Complete Annual Exam. He feels well. He reports exercising play golf and yard work. He reports he is sleeping fairly well.  ? ?Colonoscopy: 01/2018 repeat 10 yrs ? ?Immunization History  ?Administered Date(s) Administered  ? Moderna Sars-Covid-2 Vaccination 11/30/2019, 12/27/2019  ? Pneumococcal Conjugate-13 08/06/2020  ? Pneumococcal Polysaccharide-23 03/02/2016  ? Tdap 01/28/2014  ? ?Health Maintenance Due  ?Topic Date Due  ? Zoster Vaccines- Shingrix (1 of 2) Never done  ? Pneumonia Vaccine 59+ Years old (3 - PPSV23 if available, else PCV20) 08/06/2021  ?  ?Lab Results  ?Component Value Date  ? PSA1 0.6 12/11/2020  ? PSA1 0.7 10/17/2019  ? PSA1 0.8 10/12/2018  ? ? ?Hypertension ?This is a chronic problem. The problem is controlled. Pertinent negatives include no chest pain, headaches, palpitations or shortness of breath. Past treatments include ACE inhibitors and diuretics.  ?Hyperlipidemia ?This is a chronic problem. The problem is controlled. Pertinent negatives include no chest pain, myalgias or shortness of breath. Current antihyperlipidemic treatment includes statins.  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 ?Tobacco use - he continues to smoke.  Recent lung cancer screening showed no suspicious findings. ? ?Lab Results  ?Component Value Date  ? NA 143 07/30/2021  ? K 4.4 07/30/2021  ? CO2 23 07/30/2021  ? GLUCOSE 99 07/30/2021  ? BUN 22 07/30/2021  ? CREATININE 1.13 07/30/2021  ? CALCIUM 9.7 07/30/2021  ? EGFR 72 07/30/2021  ? GFRNONAA 71 10/17/2019  ? ?Lab Results  ?Component Value Date  ? CHOL 186 12/11/2020  ? HDL 56 12/11/2020  ? [place] 94 12/11/2020  ? TRIG 211 (H) 12/11/2020  ? CHOLHDL 3.3 12/11/2020  ? ?Lab Results  ?Component Value Date  ? TSH 1.40 09/25/2013  ? ?Lab Results  ?Component Value Date  ?  HGBA1C 5.6 07/30/2021  ? ?Lab Results  ?Component Value Date  ? WBC 3.2 (L) 12/11/2020  ? HGB 16.9 12/11/2020  ? HCT 48.0 12/11/2020  ? MCV 96 12/11/2020  ? PLT 145 (L) 12/11/2020  ? ?Lab Results  ?Component Value Date  ? ALT 32 12/11/2020  ? AST 24 12/11/2020  ? ALKPHOS 68 12/11/2020  ? BILITOT 0.5 12/11/2020  ? ?No results found for: 25OHVITD2, [place], VD25OH  ? ?Review of Systems  ?Constitutional:  Negative for appetite change, chills, diaphoresis, fatigue and unexpected weight change.  ?HENT:  Negative for hearing loss, tinnitus, trouble swallowing and voice change.   ?Eyes:  Negative for visual disturbance.  ?Respiratory:  Negative for choking, shortness of breath and wheezing.   ?Cardiovascular:  Negative for chest pain, palpitations and leg swelling.  ?Gastrointestinal:  Negative for abdominal pain, blood in stool, constipation and diarrhea.  ?Genitourinary:  Negative for difficulty urinating, dysuria, frequency, hematuria and urgency.  ?Musculoskeletal:  Negative for arthralgias, back pain and myalgias.  ?Skin:  Negative for color change and rash.  ?Allergic/Immunologic: Negative for environmental allergies.  ?Neurological:  Negative for dizziness, syncope and headaches.  ?Hematological:  Negative for adenopathy.  ?Psychiatric/Behavioral:  Positive for sleep disturbance. Negative for dysphoric mood. The patient is not nervous/anxious.   ? ?Patient Active Problem List  ? Diagnosis Date Noted  ? Prediabetes 02/26/2021  ? Spermatocele 12/11/2020  ? Centrilobular emphysema ([place]) 09/18/2020  ? Ascending aorta dilatation (  [place]) 09/18/2020  ? Acquired thrombophilia ([place]) 08/06/2020  ? Aortic atherosclerosis ([place]) 10/12/2018  ? Anticoagulated on warfarin 10/12/2018  ? Personal history of tobacco use, presenting hazards to health 08/31/2018  ? Benign colon polyp 01/10/2015  ? Hypercholesteremia 01/10/2015  ? Essential hypertension 01/10/2015  ? History of pulmonary embolus (PE) 01/10/2015  ? 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 Tobacco use disorder 01/10/2015  ? ? ?No Known Allergies ? ?Past Surgical History:  ?Procedure Laterality Date  ? COLONOSCOPY    ? COLONOSCOPY WITH PROPOFOL N/A 01/27/2018  ? Procedure: COLONOSCOPY WITH PROPOFOL;  Surgeon: Lollie Sails, MD;  Location: [facility] ENDOSCOPY;  Service: Endoscopy;  Laterality: N/A;  ? VASCULAR SURGERY  2013  ? Leg  ? VASECTOMY    ? ? ?Social History  ? ?Tobacco Use  ? Smoking status: Every Day  ?  Packs/day: 1.00  ?  Years: 45.00  ?  Pack years: 45.00  ?  Types: Cigarettes  ? Smokeless tobacco: Never  ? Tobacco comments:  ?  currently .75ppd  ?Vaping Use  ? Vaping Use: Never used  ?Substance Use Topics  ? Alcohol use: Yes  ?  Alcohol/week: 28.0 standard drinks  ?  Types: 28 Cans of beer per week  ?  Comment: 4 beers a day  ? Drug use: No  ?  Comment: Quit in 1997  ? ? ? ?Medication list has been reviewed and updated. ? ?Current Meds  ?Medication Sig  ? lisinopril-hydrochlorothiazide (ZESTORETIC) 20-25 MG tablet Take 2 tablets by mouth daily.  ? rosuvastatin (CRESTOR) 20 MG tablet Take 1 tablet (20 mg total) by mouth daily.  ? warfarin (COUMADIN) 7.5 MG tablet TAKE 1 TABLET BY MOUTH ONCE DAILY. AND TAKE AN EXTRA ONE-HALF TABLET ONCE A WEEK (Patient taking differently: 7.5 mg. TAKE 1/2 TABLET BY MOUTH ON MONDAYs and ONE WHOLE TABLET DAILY THE REST OF THE WEEK)  ? ? ? ?  12/03/2021  ?  8:38 AM 07/30/2021  ?  8:37 AM 02/26/2021  ?  9:14 AM 01/13/2021  ?  9:40 AM  ?GAD 7 : Generalized Anxiety Score  ?Nervous, Anxious, on Edge 0 0 0 0  ?Control/stop worrying 0 0 0 1  ?Worry too much - different things 0 0 0 1  ?Trouble relaxing 0 0 0 0  ?Restless 0 0 0 0  ?Easily annoyed or irritable 0 0 0 0  ?Afraid - awful might happen 0 0 0 0  ?Total GAD 7 Score 0 0 0 2  ?Anxiety Difficulty  Not difficult at all    ? ? ? ?  12/03/2021  ?  8:37 AM  ?Depression screen PHQ 2/9  ?Decreased Interest 0  ?Down, Depressed, Hopeless 0  ?PHQ - 2 Score 0  ?Altered sleeping 1  ?Tired, decreased energy 0  ?Change in  appetite 0  ?Feeling bad or failure about yourself  0  ?Trouble concentrating 0  ?Moving slowly or fidgety/restless 0  ?Suicidal thoughts 0  ?PHQ-9 Score 1  ?Difficult doing work/chores Not difficult at all  ? ? ?BP Readings from Last 3 Encounters:  ?12/03/21 130/80  ?07/30/21 128/76  ?02/26/21 138/84  ? ? ?Physical Exam ?Vitals and nursing note reviewed.  ?Constitutional:   ?   Appearance: Normal appearance. He is well-developed.  ?HENT:  ?   Head: Normocephalic.  ?   Right Ear: Tympanic membrane, ear canal and external ear normal.  ?   Left Ear: Tympanic membrane, ear canal and external ear normal.  ?  Nose: Nose normal.  ?Eyes:  ?   Conjunctiva/sclera: Conjunctivae normal.  ?   Pupils: Pupils are equal, round, and reactive to light.  ?Neck:  ?   Thyroid: No thyromegaly.  ?   Vascular: No carotid bruit.  ?Cardiovascular:  ?   Rate and Rhythm: Normal rate and regular rhythm.  ?   Heart sounds: Normal heart sounds.  ?Pulmonary:  ?   Effort: Pulmonary effort is normal.  ?   Breath sounds: Normal breath sounds. No wheezing.  ?Chest:  ?Breasts: ?   Right: No mass.  ?   Left: No mass.  ?Abdominal:  ?   General: Bowel sounds are normal.  ?   Palpations: Abdomen is soft.  ?   Tenderness: There is no abdominal tenderness.  ?Musculoskeletal:     ?   General: Normal range of motion.  ?   Cervical back: Normal range of motion and neck supple.  ?Lymphadenopathy:  ?   Cervical: No cervical adenopathy.  ?Skin: ?   General: Skin is warm and dry.  ?Neurological:  ?   Mental Status: He is alert and oriented to person, place, and time.  ?   Deep Tendon Reflexes: Reflexes are normal and symmetric.  ?Psychiatric:     ?   Attention and Perception: Attention normal.     ?   Mood and Affect: Mood normal.     ?   Thought Content: Thought content normal.  ? ? ?Wt Readings from Last 3 Encounters:  ?12/03/21 199 lb (90.3 kg)  ?10/13/21 200 lb (90.7 kg)  ?07/30/21 201 lb 12.8 oz (91.5 kg)  ? ? ?BP 130/80 (BP Location: Right Arm, Cuff Size:  Large)   Pulse 77   Ht 6' (1.829 m)   Wt 199 lb (90.3 kg)   SpO2 95%   BMI 26.99 kg/m?  ? ?Assessment and Plan: ?1. Annual physical exam ?Normal exam. ?Continue healthy diet, regular exercise ?Up to date on s

## 2021-12-03 NOTE — Patient Instructions (Signed)
Schedule a Dermatology evaluation ?[facility] ?

## 2021-12-04 LAB — HEMOGLOBIN A1C
Est. average glucose Bld gHb Est-mCnc: 117 mg/dL
Hgb A1c MFr Bld: 5.7 % — ABNORMAL HIGH (ref 4.8–5.6)

## 2021-12-04 LAB — CBC WITH DIFFERENTIAL/PLATELET
Basophils Absolute: 0 10*3/uL (ref 0.0–0.2)
Basos: 1 %
EOS (ABSOLUTE): 0.1 10*3/uL (ref 0.0–0.4)
Eos: 3 %
Hematocrit: 46.7 % (ref 37.5–51.0)
Hemoglobin: 16.6 g/dL (ref 13.0–17.7)
Immature Grans (Abs): 0 10*3/uL (ref 0.0–0.1)
Immature Granulocytes: 0 %
Lymphocytes Absolute: 1.5 10*3/uL (ref 0.7–3.1)
Lymphs: 35 %
MCH: 34 pg — ABNORMAL HIGH (ref 26.6–33.0)
MCHC: 35.5 g/dL (ref 31.5–35.7)
MCV: 96 fL (ref 79–97)
Monocytes Absolute: 0.4 10*3/uL (ref 0.1–0.9)
Monocytes: 10 %
Neutrophils Absolute: 2.1 10*3/uL (ref 1.4–7.0)
Neutrophils: 51 %
Platelets: 167 10*3/uL (ref 150–450)
RBC: 4.88 x10E6/uL (ref 4.14–5.80)
RDW: 12.8 % (ref 11.6–15.4)
WBC: 4.2 10*3/uL (ref 3.4–10.8)

## 2021-12-04 LAB — PSA: Prostate Specific Ag, Serum: 0.8 ng/mL (ref 0.0–4.0)

## 2021-12-04 LAB — COMPREHENSIVE METABOLIC PANEL
ALT: 31 IU/L (ref 0–44)
AST: 27 IU/L (ref 0–40)
Albumin/Globulin Ratio: 2.8 — ABNORMAL HIGH (ref 1.2–2.2)
Albumin: 4.7 g/dL (ref 3.8–4.8)
Alkaline Phosphatase: 53 IU/L (ref 44–121)
BUN/Creatinine Ratio: 15 (ref 10–24)
BUN: 17 mg/dL (ref 8–27)
Bilirubin Total: 0.4 mg/dL (ref 0.0–1.2)
CO2: 20 mmol/L (ref 20–29)
Calcium: 9.6 mg/dL (ref 8.6–10.2)
Chloride: 109 mmol/L — ABNORMAL HIGH (ref 96–106)
Creatinine, Ser: 1.15 mg/dL (ref 0.76–1.27)
Globulin, Total: 1.7 g/dL (ref 1.5–4.5)
Glucose: 102 mg/dL — ABNORMAL HIGH (ref 70–99)
Potassium: 4 mmol/L (ref 3.5–5.2)
Sodium: 145 mmol/L — ABNORMAL HIGH (ref 134–144)
Total Protein: 6.4 g/dL (ref 6.0–8.5)
eGFR: 70 mL/min/{1.73_m2} (ref >59)

## 2021-12-04 LAB — LIPID PANEL
Chol/HDL Ratio: 3 ratio (ref 0.0–5.0)
Cholesterol, Total: 185 mg/dL (ref 100–199)
HDL: 62 mg/dL (ref >39)
LDL Chol Calc (NIH): 101 mg/dL — ABNORMAL HIGH (ref 0–99)
Triglycerides: 127 mg/dL (ref 0–149)
VLDL Cholesterol Cal: 22 mg/dL (ref 5–40)

## 2021-12-04 LAB — PROTIME-INR
INR: 3 — ABNORMAL HIGH (ref 0.9–1.2)
Prothrombin Time: 29.7 s — ABNORMAL HIGH (ref 9.1–12.0)

## 2021-12-15 ENCOUNTER — Encounter: Payer: Medicare HMO | Admitting: Internal Medicine

## 2022-05-20 IMAGING — CT CT CHEST LUNG CANCER SCREENING LOW DOSE W/O CM
2 of 5 series · 15 of 40 positions shown, 18 images · non-contrast
Comparison: CT lung cancer screening dated September 18, 2020

CLINICAL DATA: Current smoker 48 pack-year history



[Series 3: lung 1.00 · axial · 0.76mm/px · z∈[-1252,-938]mm · 12 of 346 slices shown, 15 images]
[im 16/346  mediastinal]
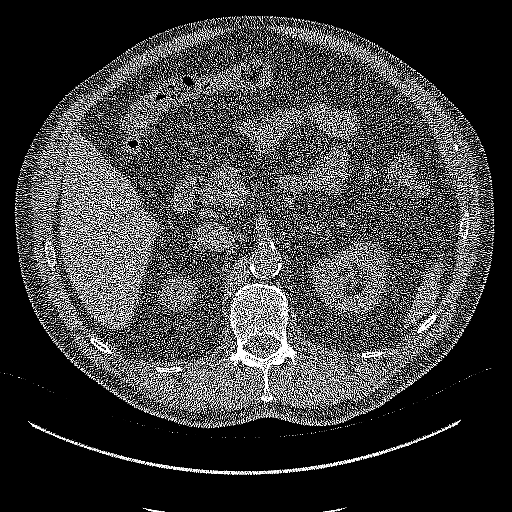
[im 16/346  lung]
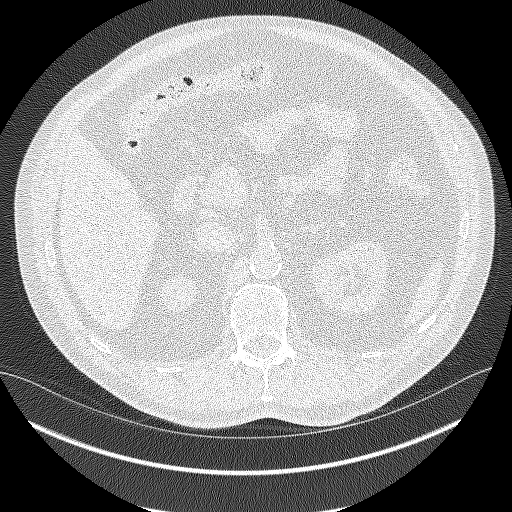
[im 48/346  lung]
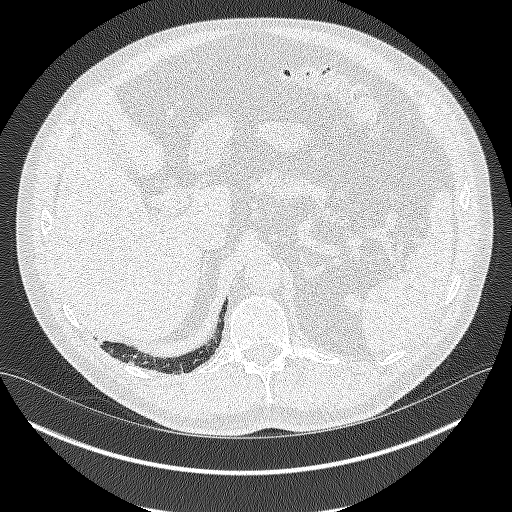
[im 79/346  lung]
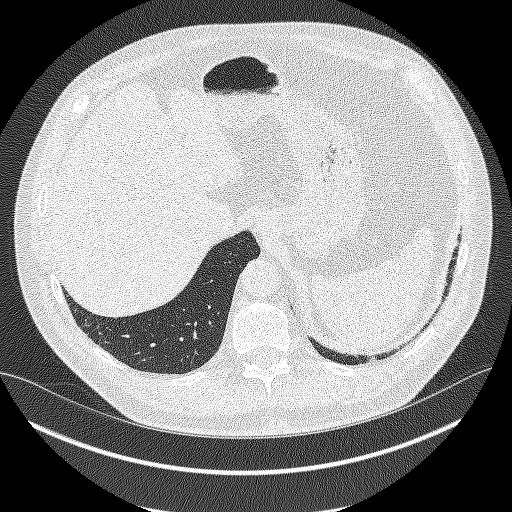
[im 110/346  lung]
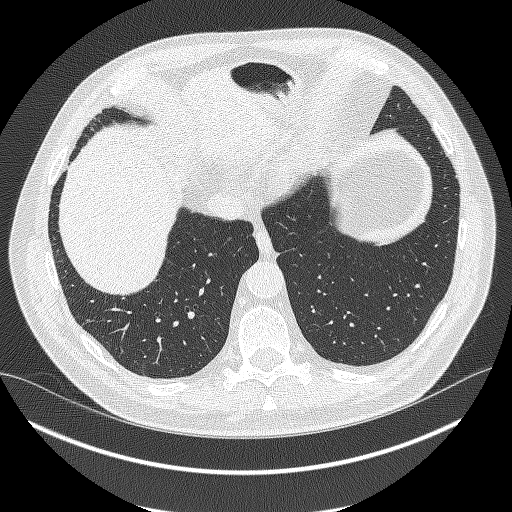
[im 126/346  mediastinal]
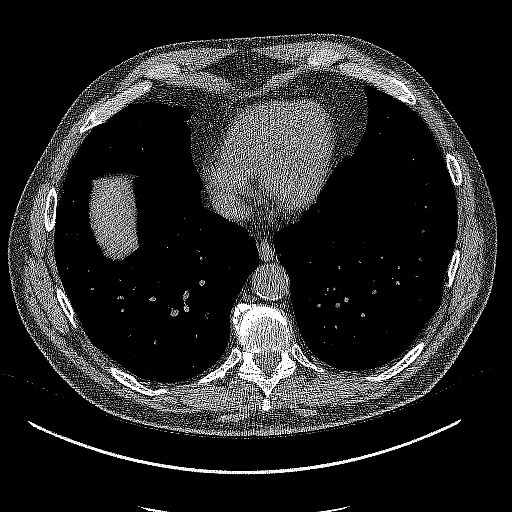
[im 126/346  lung]
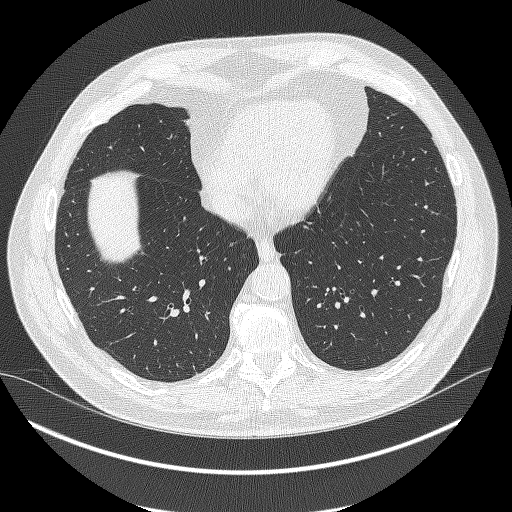
[im 157/346  lung]
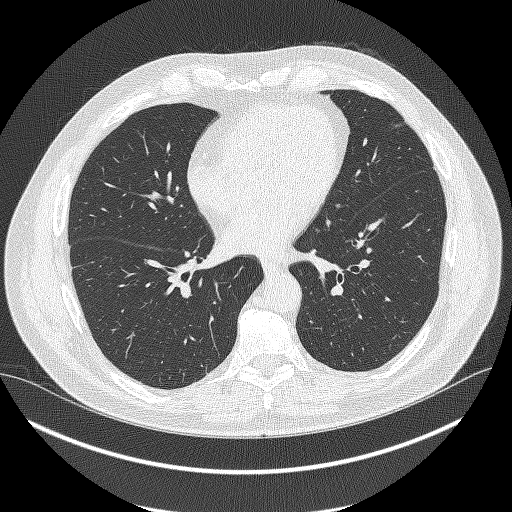
[im 189/346  lung]
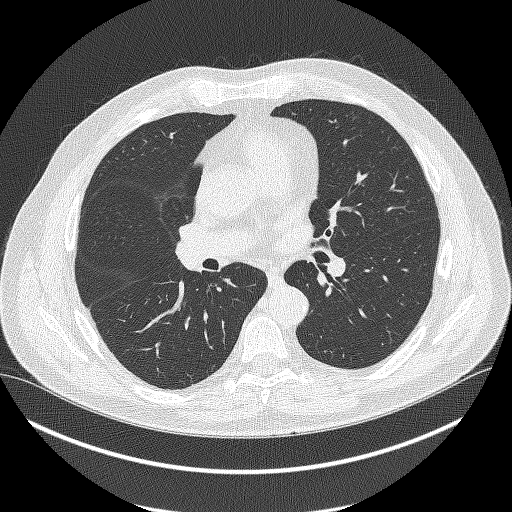
[im 220/346  lung]
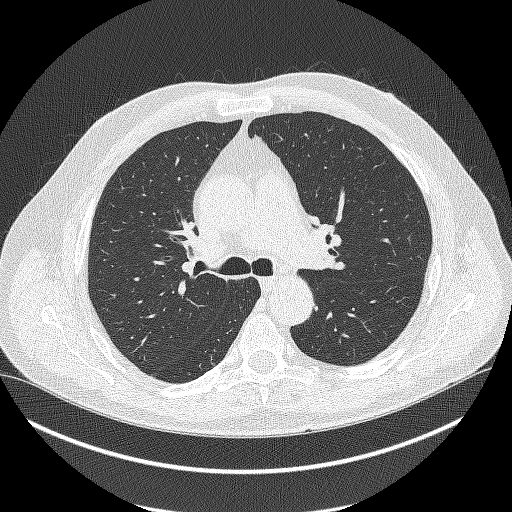
[im 236/346  mediastinal]
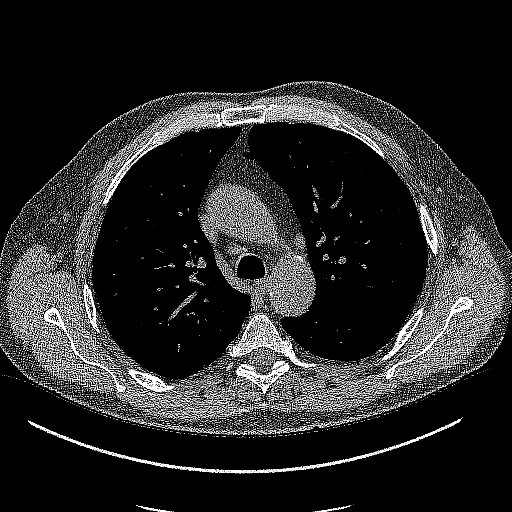
[im 236/346  lung]
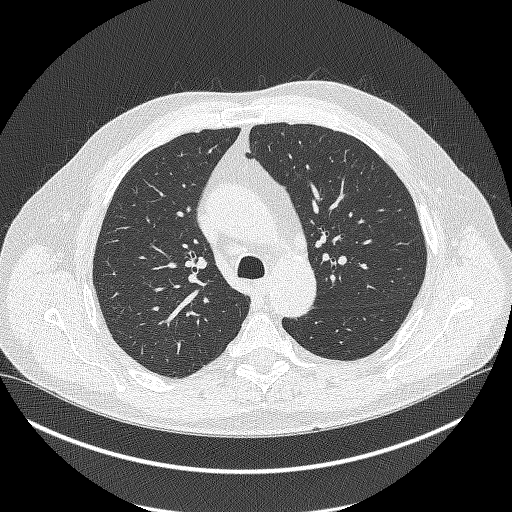
[im 267/346  lung]
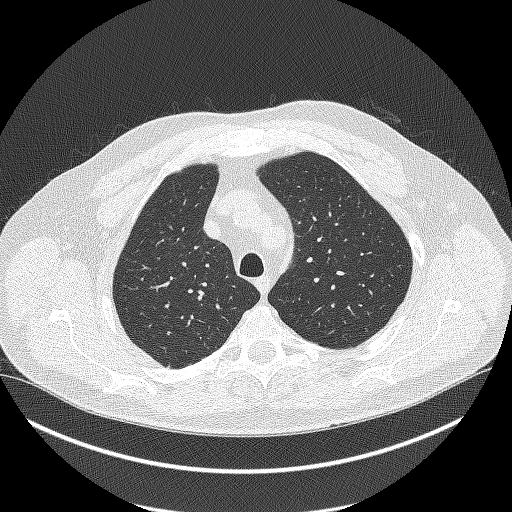
[im 298/346  lung]
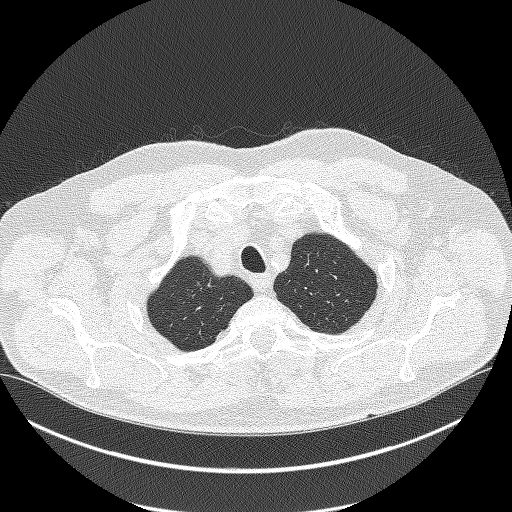
[im 330/346  lung]
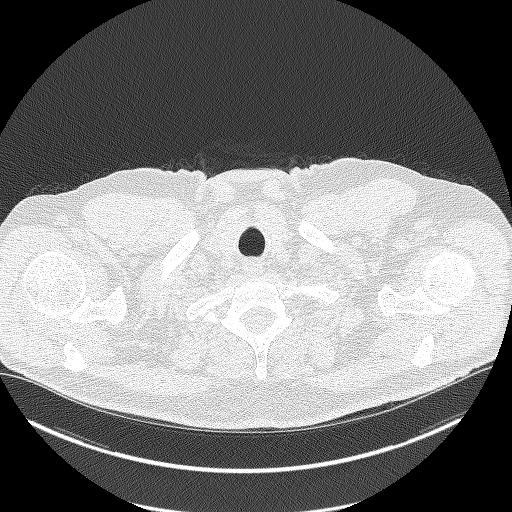

[Series 5: coronals lung 1.00 cor · coronal · 0.68mm/px · 3 of 389 slices shown]
[im 78/389  lung]
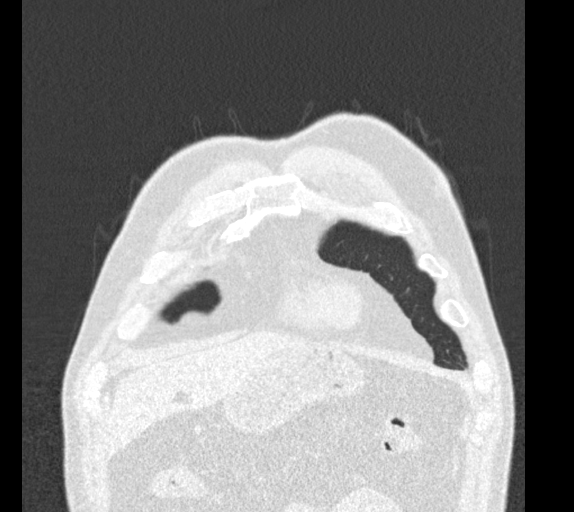
[im 156/389  lung]
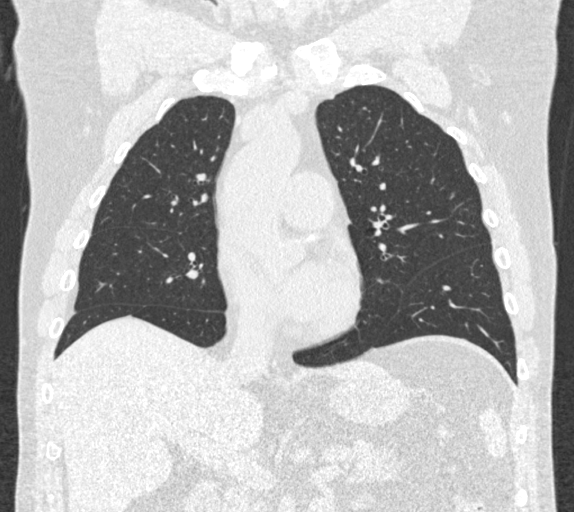
[im 233/389  lung]
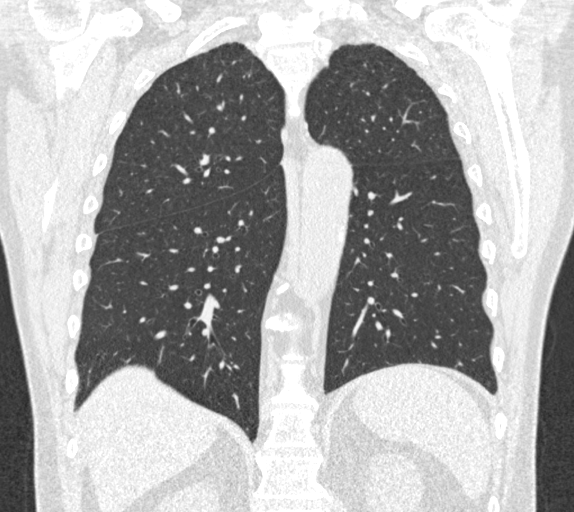

[15 of 40 positions shown; findings below may reference images not displayed]

FINDINGS: Cardiovascular: Normal heart size. No pericardial effusion. Left
main and three-vessel coronary artery calcifications. Ascending
thoracic aorta is upper limits of normal in size measuring up to
cm, unchanged compared to prior exam. Atherosclerotic disease of the
thoracic aorta.

Mediastinum/Nodes: Esophagus and thyroid are unremarkable. No
pathologically enlarged lymph nodes seen in the chest.

Lungs/Pleura: Central airways are patent. Centrilobular emphysema.
No consolidation, pleural effusion or pneumothorax. Stable small
solid pulmonary nodules. Largest is located in the right upper lobe
and measures 2.7 mm in mean diameter on image 184.

Upper Abdomen: No acute abnormality.

Musculoskeletal: No chest wall mass or suspicious bone lesions
identified.
IMPRESSION: 1. Lung-RADS 2, benign appearance or behavior. Continue annual
screening with low-dose chest CT without contrast in 12 months.
2. Coronary artery calcifications, aortic Atherosclerosis
(817HH-5E4.4) and Emphysema (817HH-NQN.Y).

## 2022-06-08 ENCOUNTER — Ambulatory Visit (INDEPENDENT_AMBULATORY_CARE_PROVIDER_SITE_OTHER): Payer: Medicare HMO | Admitting: Internal Medicine

## 2022-06-08 ENCOUNTER — Encounter: Payer: Self-pay | Admitting: Internal Medicine

## 2022-06-08 VITALS — BP 120/64 | HR 74 | Ht 73.0 in | Wt 201.4 lb

## 2022-06-08 DIAGNOSIS — I10 Essential (primary) hypertension: Secondary | ICD-10-CM

## 2022-06-08 DIAGNOSIS — Z7901 Long term (current) use of anticoagulants: Secondary | ICD-10-CM

## 2022-06-08 DIAGNOSIS — Z86711 Personal history of pulmonary embolism: Secondary | ICD-10-CM | POA: Diagnosis not present

## 2022-06-08 DIAGNOSIS — R7303 Prediabetes: Secondary | ICD-10-CM | POA: Diagnosis not present

## 2022-06-08 NOTE — Progress Notes (Signed)
Date:  06/08/2022   Name:  Tyler Estes   DOB:  1956-06-23   MRN:  [id number]   Chief Complaint: Hypertension  Hypertension This is a chronic problem. The problem is controlled. Associated symptoms include shortness of breath. Pertinent negatives include no chest pain, headaches or palpitations. Past treatments include ACE inhibitors and diuretics. The current treatment provides significant improvement. There are no compliance problems.  There is no history of kidney disease, CAD/MI or CVA.  Diabetes He presents for his follow-up diabetic visit. Diabetes type: prediabetes. His disease course has been stable. Pertinent negatives for hypoglycemia include no dizziness, headaches or nervousness/anxiousness. Pertinent negatives for diabetes include no chest pain, no fatigue and no weakness. Pertinent negatives for diabetic complications include no CVA.  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  No bleeding issues.  He would consider changing to Eliquis but wants to check the cost.  Lab Results  Component Value Date   NA 145 (H) 12/03/2021   K 4.0 12/03/2021   CO2 20 12/03/2021   GLUCOSE 102 (H) 12/03/2021   BUN 17 12/03/2021   CREATININE 1.15 12/03/2021   CALCIUM 9.6 12/03/2021   EGFR 70 12/03/2021   GFRNONAA 71 10/17/2019   Lab Results  Component Value Date   CHOL 185 12/03/2021   HDL 62 12/03/2021   LDLCALC 101 (H) 12/03/2021   TRIG 127 12/03/2021   CHOLHDL 3.0 12/03/2021   Lab Results  Component Value Date   TSH 1.40 09/25/2013   Lab Results  Component Value Date   HGBA1C 5.7 (H) 12/03/2021   Lab Results  Component Value Date   WBC 4.2 12/03/2021   HGB 16.6 12/03/2021   HCT 46.7 12/03/2021   MCV 96 12/03/2021   PLT 167 12/03/2021   Lab Results  Component Value Date   ALT 31 12/03/2021   AST 27 12/03/2021   ALKPHOS 53 12/03/2021   BILITOT 0.4 12/03/2021   No results found for: "25OHVITD2", "25OHVITD3", "VD25OH"   Review of Systems  Constitutional:   Negative for fatigue and unexpected weight change.  HENT:  Negative for nosebleeds.   Eyes:  Negative for visual disturbance.  Respiratory:  Positive for cough and shortness of breath. Negative for chest tightness and wheezing.   Cardiovascular:  Negative for chest pain, palpitations and leg swelling.  Gastrointestinal:  Negative for abdominal pain, constipation and diarrhea.  Musculoskeletal:  Negative for arthralgias and joint swelling.  Neurological:  Negative for dizziness, weakness, light-headedness and headaches.  Hematological:  Does not bruise/bleed easily.  Psychiatric/Behavioral:  Negative for dysphoric mood. The patient is not nervous/anxious.     Patient Active Problem List   Diagnosis Date Noted   Prediabetes 02/26/2021   Spermatocele 12/11/2020   Centrilobular emphysema ([place]) 09/18/2020   Ascending aorta dilatation ([place]) 09/18/2020   Acquired thrombophilia ([place]) 08/06/2020   Aortic atherosclerosis ([place]) 10/12/2018   Anticoagulated on warfarin 10/12/2018   Personal history of tobacco use, presenting hazards to health 08/31/2018   Benign colon polyp 01/10/2015   Hypercholesteremia 01/10/2015   Essential hypertension 01/10/2015   History of pulmonary embolus (PE) 01/10/2015   Leg varices 01/10/2015   Tobacco use disorder 01/10/2015    No Known Allergies  Past Surgical History:  Procedure Laterality Date   COLONOSCOPY     COLONOSCOPY WITH PROPOFOL N/A 01/27/2018   Procedure: COLONOSCOPY WITH PROPOFOL;  Surgeon: Lollie Sails, MD;  Location: [facility] ENDOSCOPY;  Service: Endoscopy;  Laterality: N/A;   VASCULAR SURGERY  2013  Leg   VASECTOMY      Social History   Tobacco Use   Smoking status: Every Day    Packs/day: 1.00    Years: 45.00    Total pack years: 45.00    Types: Cigarettes   Smokeless tobacco: Never   Tobacco comments:    currently .75ppd  Vaping Use   Vaping Use: Never used  Substance Use Topics   Alcohol use: Yes    Alcohol/week: 28.0  standard drinks of alcohol    Types: 28 Cans of beer per week    Comment: 4 beers a day   Drug use: No    Comment: Quit in 1997     Medication list has been reviewed and updated.  Current Meds  Medication Sig   lisinopril-hydrochlorothiazide (ZESTORETIC) 20-25 MG tablet Take 2 tablets by mouth daily.   rosuvastatin (CRESTOR) 20 MG tablet Take 1 tablet (20 mg total) by mouth daily.   warfarin (COUMADIN) 7.5 MG tablet TAKE 1/2 TABLET BY MOUTH ON MONDAYs and ONE WHOLE TABLET DAILY THE REST OF THE WEEK       06/08/2022    8:38 AM 12/03/2021    8:38 AM 07/30/2021    8:37 AM 02/26/2021    9:14 AM  GAD 7 : Generalized Anxiety Score  Nervous, Anxious, on Edge 0 0 0 0  Control/stop worrying 0 0 0 0  Worry too much - different things 0 0 0 0  Trouble relaxing 0 0 0 0  Restless 0 0 0 0  Easily annoyed or irritable 0 0 0 0  Afraid - awful might happen 0 0 0 0  Total GAD 7 Score 0 0 0 0  Anxiety Difficulty Not difficult at all  Not difficult at all        06/08/2022    8:37 AM 12/03/2021    8:37 AM 08/12/2021    3:36 PM  Depression screen PHQ 2/9  Decreased Interest 0 0 0  Down, Depressed, Hopeless 0 0 0  PHQ - 2 Score 0 0 0  Altered sleeping 0 1   Tired, decreased energy 0 0   Change in appetite 0 0   Feeling bad or failure about yourself  0 0   Trouble concentrating 0 0   Moving slowly or fidgety/restless 0 0   Suicidal thoughts 0 0   PHQ-9 Score 0 1   Difficult doing work/chores Not difficult at all Not difficult at all     BP Readings from Last 3 Encounters:  06/08/22 120/64  12/03/21 130/80  07/30/21 128/76    Physical Exam Vitals and nursing note reviewed.  Constitutional:      General: He is not in acute distress.    Appearance: He is well-developed.  HENT:     Head: Normocephalic and atraumatic.  Neck:     Vascular: No carotid bruit.  Cardiovascular:     Rate and Rhythm: Normal rate and regular rhythm.  Pulmonary:     Effort: Pulmonary effort is  normal. No respiratory distress.     Breath sounds: No wheezing or rhonchi.  Musculoskeletal:     Cervical back: Normal range of motion.     Right lower leg: No edema.     Left lower leg: No edema.  Lymphadenopathy:     Cervical: No cervical adenopathy.  Skin:    General: Skin is warm and dry.     Findings: No rash.  Neurological:     General: No focal deficit present.  Mental Status: He is alert and oriented to person, place, and time.  Psychiatric:        Mood and Affect: Mood normal.        Behavior: Behavior normal.     Wt Readings from Last 3 Encounters:  06/08/22 201 lb 6.4 oz (91.4 kg)  12/03/21 199 lb (90.3 kg)  10/13/21 200 lb (90.7 kg)    BP 120/64 (BP Location: Right Arm, Patient Position: Sitting, Cuff Size: Normal)   Pulse 74   Ht 6' 1"  (1.854 m)   Wt 201 lb 6.4 oz (91.4 kg)   SpO2 96%   BMI 26.57 kg/m   Assessment and Plan: 1. Essential hypertension Clinically stable exam with well controlled BP. Tolerating medications without side effects at this time. Pt to continue current regimen and low sodium diet; benefits of regular exercise as able discussed. - Basic metabolic panel  2. Prediabetes Continue dietary changes - Hemoglobin A1c  3. History of pulmonary embolus (PE) On Warfarin He will check on cost of Eliquis and call if he wants an Rx  4. Anticoagulated on warfarin - Protime-INR   Partially dictated using Editor, commissioning. Any errors are unintentional.  Halina Maidens, MD [facility]  06/08/2022

## 2022-06-08 NOTE — Patient Instructions (Signed)
Ask about cost of Eliquis 5 mg twice a day.

## 2022-06-09 LAB — BASIC METABOLIC PANEL
BUN/Creatinine Ratio: 13 (ref 10–24)
BUN: 15 mg/dL (ref 8–27)
CO2: 22 mmol/L (ref 20–29)
Calcium: 9.8 mg/dL (ref 8.6–10.2)
Chloride: 101 mmol/L (ref 96–106)
Creatinine, Ser: 1.16 mg/dL (ref 0.76–1.27)
Glucose: 104 mg/dL — ABNORMAL HIGH (ref 70–99)
Potassium: 4.2 mmol/L (ref 3.5–5.2)
Sodium: 139 mmol/L (ref 134–144)
eGFR: 69 mL/min/{1.73_m2} (ref >59)

## 2022-06-09 LAB — HEMOGLOBIN A1C
Est. average glucose Bld gHb Est-mCnc: 120 mg/dL
Hgb A1c MFr Bld: 5.8 % — ABNORMAL HIGH (ref 4.8–5.6)

## 2022-06-09 LAB — PROTIME-INR
INR: 3.4 — ABNORMAL HIGH (ref 0.9–1.2)
Prothrombin Time: 34 s — ABNORMAL HIGH (ref 9.1–12.0)

## 2022-06-29 ENCOUNTER — Other Ambulatory Visit: Payer: Self-pay | Admitting: Internal Medicine

## 2022-06-29 DIAGNOSIS — E7800 Pure hypercholesterolemia, unspecified: Secondary | ICD-10-CM

## 2022-06-29 NOTE — Telephone Encounter (Signed)
Requested Prescriptions  Pending Prescriptions Disp Refills   rosuvastatin (CRESTOR) 20 MG tablet [Pharmacy Med Name: Rosuvastatin Calcium 20 MG Oral Tablet] 90 tablet 1    Sig: Take 1 tablet by mouth once daily     Cardiovascular:  Antilipid - Statins 2 Failed - 06/29/2022  6:50 AM      Failed - Lipid Panel in normal range within the last 12 months    Cholesterol, Total  Date Value Ref Range Status  12/03/2021 185 100 - 199 mg/dL Final   LDL Chol Calc (NIH)  Date Value Ref Range Status  12/03/2021 101 (H) 0 - 99 mg/dL Final   HDL  Date Value Ref Range Status  12/03/2021 62 >39 mg/dL Final   Triglycerides  Date Value Ref Range Status  12/03/2021 127 0 - 149 mg/dL Final         Passed - Cr in normal range and within 360 days    Creatinine, Ser  Date Value Ref Range Status  06/08/2022 1.16 0.76 - 1.27 mg/dL Final         Passed - Patient is not pregnant      Passed - Valid encounter within last 12 months    Recent Outpatient Visits           3 weeks ago Essential hypertension   [place] [facility] at [facility], Nyoka Cowden, MD   6 months ago Annual physical exam   [facility] at [facility], Nyoka Cowden, MD   11 months ago Essential hypertension   [place] [facility] at [facility], Nyoka Cowden, MD   1 year ago Essential hypertension   [place] [facility] at [facility], Nyoka Cowden, MD   1 year ago Essential hypertension   [place] [facility] at [facility], Nyoka Cowden, MD       Future Appointments             In 5 months Judithann Graves, Nyoka Cowden, MD [facility] at [facility], [facility]

## 2022-08-10 ENCOUNTER — Other Ambulatory Visit: Payer: Self-pay | Admitting: Internal Medicine

## 2022-08-10 DIAGNOSIS — I10 Essential (primary) hypertension: Secondary | ICD-10-CM

## 2022-08-18 ENCOUNTER — Ambulatory Visit: Payer: Medicare HMO

## 2022-08-20 ENCOUNTER — Ambulatory Visit (INDEPENDENT_AMBULATORY_CARE_PROVIDER_SITE_OTHER): Payer: Medicare HMO

## 2022-08-20 DIAGNOSIS — Z0000 Encounter for general adult medical examination without abnormal findings: Secondary | ICD-10-CM | POA: Diagnosis not present

## 2022-08-20 NOTE — Patient Instructions (Signed)
Health Maintenance, Male Adopting a healthy lifestyle and getting preventive care are important in promoting health and wellness. Ask your health care provider about: The right schedule for you to have regular tests and exams. Things you can do on your own to prevent diseases and keep yourself healthy. What should I know about diet, weight, and exercise? Eat a healthy diet  Eat a diet that includes plenty of vegetables, fruits, low-fat dairy products, and lean protein. Do not eat a lot of foods that are high in solid fats, added sugars, or sodium. Maintain a healthy weight Body mass index (BMI) is a measurement that can be used to identify possible weight problems. It estimates body fat based on height and weight. Your health care provider can help determine your BMI and help you achieve or maintain a healthy weight. Get regular exercise Get regular exercise. This is one of the most important things you can do for your health. Most adults should: Exercise for at least 150 minutes each week. The exercise should increase your heart rate and make you sweat (moderate-intensity exercise). Do strengthening exercises at least twice a week. This is in addition to the moderate-intensity exercise. Spend less time sitting. Even light physical activity can be beneficial. Watch cholesterol and blood lipids Have your blood tested for lipids and cholesterol at 68 years of age, then have this test every 5 years. You may need to have your cholesterol levels checked more often if: Your lipid or cholesterol levels are high. You are older than 68 years of age. You are at high risk for heart disease. What should I know about cancer screening? Many types of cancers can be detected early and may often be prevented. Depending on your health history and family history, you may need to have cancer screening at various ages. This may include screening for: Colorectal cancer. Prostate cancer. Skin cancer. Lung  cancer. What should I know about heart disease, diabetes, and high blood pressure? Blood pressure and heart disease High blood pressure causes heart disease and increases the risk of stroke. This is more likely to develop in people who have high blood pressure readings or are overweight. Talk with your health care provider about your target blood pressure readings. Have your blood pressure checked: Every 3-5 years if you are 18-39 years of age. Every year if you are 40 years old or older. If you are between the ages of 65 and 75 and are a current or former smoker, ask your health care provider if you should have a one-time screening for abdominal aortic aneurysm (AAA). Diabetes Have regular diabetes screenings. This checks your fasting blood sugar level. Have the screening done: Once every three years after age 45 if you are at a normal weight and have a low risk for diabetes. More often and at a younger age if you are overweight or have a high risk for diabetes. What should I know about preventing infection? Hepatitis B If you have a higher risk for hepatitis B, you should be screened for this virus. Talk with your health care provider to find out if you are at risk for hepatitis B infection. Hepatitis C Blood testing is recommended for: Everyone born from 1945 through 1965. Anyone with known risk factors for hepatitis C. Sexually transmitted infections (STIs) You should be screened each year for STIs, including gonorrhea and chlamydia, if: You are sexually active and are younger than 68 years of age. You are older than 68 years of age and your   health care provider tells you that you are at risk for this type of infection. Your sexual activity has changed since you were last screened, and you are at increased risk for chlamydia or gonorrhea. Ask your health care provider if you are at risk. Ask your health care provider about whether you are at high risk for HIV. Your health care provider  may recommend a prescription medicine to help prevent HIV infection. If you choose to take medicine to prevent HIV, you should first get tested for HIV. You should then be tested every 3 months for as long as you are taking the medicine. Follow these instructions at home: Alcohol use Do not drink alcohol if your health care provider tells you not to drink. If you drink alcohol: Limit how much you have to 0-2 drinks a day. Know how much alcohol is in your drink. In the [place] one drink equals one 12 oz bottle of beer (355 mL), one 5 oz glass of wine (148 mL), or one 1 oz glass of hard liquor (44 mL). Lifestyle Do not use any products that contain nicotine or tobacco. These products include cigarettes, chewing tobacco, and vaping devices, such as e-cigarettes. If you need help quitting, ask your health care provider. Do not use street drugs. Do not share needles. Ask your health care provider for help if you need support or information about quitting drugs. General instructions Schedule regular health, dental, and eye exams. Stay current with your vaccines. Tell your health care provider if: You often feel depressed. You have ever been abused or do not feel safe at home. Summary Adopting a healthy lifestyle and getting preventive care are important in promoting health and wellness. Follow your health care provider's instructions about healthy diet, exercising, and getting tested or screened for diseases. Follow your health care provider's instructions on monitoring your cholesterol and blood pressure. This information is not intended to replace advice given to you by your health care provider. Make sure you discuss any questions you have with your health care provider. Document Revised: 12/22/2020 Document Reviewed: 12/22/2020 Elsevier Patient Education  2023 Elsevier Inc.  

## 2022-08-20 NOTE — Progress Notes (Signed)
I connected with  Vivi Ferns on 08/20/22 by a audio enabled telemedicine application and verified that I am speaking with the correct person using two identifiers.  Patient Location: Home  Provider Location: Office/Clinic  I discussed the limitations of evaluation and management by telemedicine. The patient expressed understanding and agreed to proceed.  Subjective:   Tyler Estes is a 67 y.o. male who presents for Medicare Annual/Subsequent preventive examination.  Review of Systems    Per HPI unless specifically indicated below.  Cardiac Risk Factors include: advanced age (>22men, >34 women);male gender, Essential hypertension, Hypercholesteremia , and Prediabetes.          Objective:         06/08/2022    8:31 AM 12/03/2021    8:43 AM 12/03/2021    8:32 AM  Vitals with BMI  Height 6\' 1"   6\' 0"   Weight 201 lbs 6 oz  199 lbs  BMI 08.65  78.46  Systolic 962 952 841  Diastolic 64 80 76  Pulse 74  77    There were no vitals filed for this visit. There is no height or weight on file to calculate BMI.     08/20/2022    3:52 PM 08/12/2021    3:39 PM 01/27/2018    7:48 AM 03/02/2016    1:53 PM 12/31/2015    4:00 PM 09/30/2015    3:08 PM  Advanced Directives  Does Patient Have a Medical Advance Directive? No No No No No No  Would patient like information on creating a medical advance directive? No - Patient declined Yes (MAU/Ambulatory/Procedural Areas - Information given)        Current Medications (verified) Outpatient Encounter Medications as of 08/20/2022  Medication Sig   lisinopril-hydrochlorothiazide (ZESTORETIC) 20-25 MG tablet Take 2 tablets by mouth once daily   rosuvastatin (CRESTOR) 20 MG tablet Take 1 tablet by mouth once daily   warfarin (COUMADIN) 7.5 MG tablet TAKE 1/2 TABLET BY MOUTH ON MONDAYs and ONE WHOLE TABLET DAILY THE REST OF THE WEEK   No facility-administered encounter medications on file as of 08/20/2022.    Allergies (verified) Patient  has no known allergies.   History: Past Medical History:  Diagnosis Date   High cholesterol    Hx MRSA infection 01/10/2015   Hx pulmonary embolism    Hypertension    Past Surgical History:  Procedure Laterality Date   COLONOSCOPY     COLONOSCOPY WITH PROPOFOL N/A 01/27/2018   Procedure: COLONOSCOPY WITH PROPOFOL;  Surgeon: Lollie Sails, MD;  Location: [facility] ENDOSCOPY;  Service: Endoscopy;  Laterality: N/A;   VASCULAR SURGERY  2013   Leg   VASECTOMY     Family History  Problem Relation Age of Onset   Hyperlipidemia Mother    Heart disease Father    Mesothelioma Father    Dementia Sister    Social History   Socioeconomic History   Marital status: Married    Spouse name: Anjelo Pullman   Number of children: 2   Years of education: HS   Highest education level: Not on file  Occupational History   Occupation: Librarian, academic at [employer]   Occupation: Retired  Tobacco Use   Smoking status: Every Day    Packs/day: 1.00    Years: 45.00    Total pack years: 45.00    Types: Cigarettes   Smokeless tobacco: Never   Tobacco comments:    currently .75ppd  Vaping Use   Vaping  Use: Never used  Substance and Sexual Activity   Alcohol use: Yes    Alcohol/week: 28.0 standard drinks of alcohol    Types: 28 Cans of beer per week    Comment: 4 beers a day   Drug use: No    Comment: Quit in 1997   Sexual activity: Yes  Other Topics Concern   Not on file  Social History Narrative   Not on file   Social Determinants of Health   Financial Resource Strain: Low Risk  (08/20/2022)   Overall Financial Resource Strain (CARDIA)    Difficulty of Paying Living Expenses: Not hard at all  Food Insecurity: No Food Insecurity (08/20/2022)   Hunger Vital Sign    Worried About Running Out of Food in the Last Year: Never true    Ran Out of Food in the Last Year: Never true  Transportation Needs: No Transportation Needs (08/20/2022)   PRAPARE - Radiographer, therapeutic (Medical): No    Lack of Transportation (Non-Medical): No  Physical Activity: Inactive (08/20/2022)   Exercise Vital Sign    Days of Exercise per Week: 0 days    Minutes of Exercise per Session: 0 min  Stress: No Stress Concern Present (08/20/2022)   [place]    Feeling of Stress : Not at all  Social Connections: Moderately Isolated (08/20/2022)   Social Connection and Isolation Panel [NHANES]    Frequency of Communication with Friends and Family: Once a week    Frequency of Social Gatherings with Friends and Family: Three times a week    Attends Religious Services: Never    Active Member of Clubs or Organizations: No    Attends Archivist Meetings: Never    Marital Status: Married    Tobacco Counseling Ready to quit: No Counseling given: No Tobacco comments: currently .75ppd   Clinical Intake:  Pre-visit preparation completed: No  Pain : No/denies pain     Nutritional Status: BMI 25 -29 Overweight Nutritional Risks: None Diabetes: No  How often do you need to have someone help you when you read instructions, pamphlets, or other written materials from your doctor or pharmacy?: 1 - Never  Diabetic?No     Information entered by :: Donnie Mesa, [place]   Activities of Daily Living    08/20/2022    3:28 PM 12/03/2021    8:38 AM  In your present state of health, do you have any difficulty performing the following activities:  Hearing? 1 1  Vision? 0 0  Difficulty concentrating or making decisions? 1 0  Walking or climbing stairs? 0 0  Dressing or bathing? 0 0  Doing errands, shopping? 0 0    Patient Care Team: Glean Hess, MD as PCP - General (Family Medicine) Oh, Lupita Dawn, MD (Inactive) (Gastroenterology)  Indicate any recent Medical Services you may have received from other than [facility] providers in the past year (date may be approximate).    No recent medical services  received from other than [facility] providers. Assessment:   This is a routine wellness examination for Tyler Estes.  Hearing/Vision screen Difficulty hearing. Was told that he needed hearing aids, but declined because of the cost. Wear glasses. Due for an Annual Eye Exam. Dietary issues and exercise activities discussed: Current Exercise Habits: The patient has a physically strenuous job, but has no regular exercise apart from work., Exercise limited by: None identified   Goals Addressed   None  Depression Screen    08/20/2022    3:28 PM 06/08/2022    8:37 AM 12/03/2021    8:37 AM 08/12/2021    3:36 PM 07/30/2021    8:37 AM 02/26/2021    9:13 AM 01/13/2021    9:39 AM  PHQ 2/9 Scores  PHQ - 2 Score 0 0 0 0 0 0 0  PHQ- 9 Score  0 1  0 0 2    Fall Risk    08/20/2022    3:28 PM 06/08/2022    8:38 AM 12/03/2021    8:38 AM 08/12/2021    3:45 PM 07/30/2021    8:37 AM  Fall Risk   Falls in the past year? 0 0 0 0 0  Number falls in past yr: 0 0 0 0 0  Injury with Fall? 0 0 0 0 0  Risk for fall due to : No Fall Risks No Fall Risks No Fall Risks No Fall Risks No Fall Risks  Follow up Falls evaluation completed Falls evaluation completed Falls evaluation completed Falls prevention discussed Falls evaluation completed    FALL RISK PREVENTION PERTAINING TO THE HOME:  Any stairs in or around the home? No  If so, are there any without handrails? No  Home free of loose throw rugs in walkways, pet beds, electrical cords, etc? Yes  Adequate lighting in your home to reduce risk of falls? Yes   ASSISTIVE DEVICES UTILIZED TO PREVENT FALLS:  Life alert? No  Use of a cane, walker or w/c? No  Grab bars in the bathroom? Yes  Shower chair or bench in shower? No  Elevated toilet seat or a handicapped toilet? No   TIMED UP AND GO:  Was the test performed?  unable to perform, telephonic appt  .  Cognitive Function:        08/20/2022    3:33 PM  6CIT Screen  What Year? 0 points  What month? 0  points  What time? 0 points  Count back from 20 0 points  Months in reverse 0 points  Repeat phrase 0 points  Total Score 0 points    Immunizations Immunization History  Administered Date(s) Administered   Moderna Sars-Covid-2 Vaccination 11/30/2019, 12/27/2019   Pneumococcal Conjugate-13 08/06/2020   Pneumococcal Polysaccharide-23 03/02/2016   Tdap 01/28/2014    TDAP status: Up to date  Flu Vaccine status: Due, Education has been provided regarding the importance of this vaccine. Advised may receive this vaccine at local pharmacy or Health Dept. Aware to provide a copy of the vaccination record if obtained from local pharmacy or Health Dept. Verbalized acceptance and understanding.  Pneumococcal vaccine status: Due, Education has been provided regarding the importance of this vaccine. Advised may receive this vaccine at local pharmacy or Health Dept. Aware to provide a copy of the vaccination record if obtained from local pharmacy or Health Dept. Verbalized acceptance and understanding.  Covid-19 vaccine status: Information provided on how to obtain vaccines.   Qualifies for Shingles Vaccine? No   Zostavax completed No   Shingrix Completed?: No.    Education has been provided regarding the importance of this vaccine. Patient has been advised to call insurance company to determine out of pocket expense if they have not yet received this vaccine. Advised may also receive vaccine at local pharmacy or Health Dept. Verbalized acceptance and understanding.  Screening Tests Health Maintenance  Topic Date Due   COVID-19 Vaccine (3 - 2023-24 season) 04/16/2022   Zoster Vaccines- Shingrix (1 of  2) 09/08/2022 (Originally 07/06/2005)   INFLUENZA VACCINE  11/14/2022 (Originally 03/16/2022)   Pneumonia Vaccine 73+ Years old (3 - PPSV23 or PCV20) 06/09/2023 (Originally 08/06/2021)   Lung Cancer Screening  10/13/2022   Medicare Annual Wellness (AWV)  08/21/2023   DTaP/Tdap/Td (2 - Td or Tdap)  01/29/2024   COLONOSCOPY (Pts 45-14yrs Insurance coverage will need to be confirmed)  01/28/2028   Hepatitis C Screening  Completed   HPV VACCINES  Aged Out    Health Maintenance  Health Maintenance Due  Topic Date Due   COVID-19 Vaccine (3 - 2023-24 season) 04/16/2022    Colorectal cancer screening: Type of screening: Colonoscopy. Completed 01/27/2018. Repeat every 10 years  Lung Cancer Screening: (Low Dose CT Chest recommended if Age 36-80 years, 30 pack-year currently smoking OR have quit w/in 15years.) does qualify.   Lung Cancer Screening Referral: 10/15/2021  Additional Screening:  Hepatitis C Screening: does qualify; Completed 10/12/2017   Vision Screening: Recommended annual ophthalmology exams for early detection of glaucoma and other disorders of the eye. Is the patient up to date with their annual eye exam?  No  Who is the provider or what is the name of the office in which the patient attends annual eye exams? The pt will call [facility] and schedule an appt. If pt is not established with a provider, would they like to be referred to a provider to establish care? No .   Dental Screening: Recommended annual dental exams for proper oral hygiene  Community Resource Referral / Chronic Care Management: CRR required this visit?  No   CCM required this visit?  No      Plan:     I have personally reviewed and noted the following in the patient's chart:   Medical and social history Use of alcohol, tobacco or illicit drugs  Current medications and supplements including opioid prescriptions. Patient is not currently taking opioid prescriptions. Functional ability and status Nutritional status Physical activity Advanced directives List of other physicians Hospitalizations, surgeries, and ER visits in previous 12 months Vitals Screenings to include cognitive, depression, and falls Referrals and appointments  In addition, I have reviewed and discussed  with patient certain preventive protocols, quality metrics, and best practice recommendations. A written personalized care plan for preventive services as well as general preventive health recommendations were provided to patient.   Tyler Estes , Thank you for taking time to come for your Medicare Wellness Visit. I appreciate your ongoing commitment to your health goals. Please review the following plan we discussed and let me know if I can assist you in the future.   These are the goals we discussed:  Goals      Patient Stated     Pt states he would like to remain healthy and active        This is a list of the screening recommended for you and due dates:  Health Maintenance  Topic Date Due   COVID-19 Vaccine (3 - 2023-24 season) 04/16/2022   Zoster (Shingles) Vaccine (1 of 2) 09/08/2022*   Flu Shot  11/14/2022*   Pneumonia Vaccine (3 - PPSV23 or PCV20) 06/09/2023*   Screening for Lung Cancer  10/13/2022   Medicare Annual Wellness Visit  08/21/2023   DTaP/Tdap/Td vaccine (2 - Td or Tdap) 01/29/2024   Colon Cancer Screening  01/28/2028   Hepatitis C Screening: USPSTF Recommendation to screen - Ages 18-79 yo.  Completed   HPV Vaccine  Aged Out  *Topic was postponed. The  date shown is not the original due date.      Lonna Cobb, CMA   08/20/2022   Nurse Notes: Approximately 30 minute Non-Face -To-Face Medicare Wellness Visit

## 2022-10-13 ENCOUNTER — Ambulatory Visit: Payer: Medicare HMO

## 2022-10-14 ENCOUNTER — Ambulatory Visit
Admission: RE | Admit: 2022-10-14 | Discharge: 2022-10-14 | Disposition: A | Discharge status: Home / Self Care | Payer: Medicare HMO | Source: Ambulatory Visit | Attending: Acute Care | Admitting: Acute Care

## 2022-10-14 DIAGNOSIS — Z87891 Personal history of nicotine dependence: Secondary | ICD-10-CM | POA: Insufficient documentation

## 2022-10-14 DIAGNOSIS — F17210 Nicotine dependence, cigarettes, uncomplicated: Secondary | ICD-10-CM | POA: Diagnosis not present

## 2022-10-18 ENCOUNTER — Other Ambulatory Visit: Payer: Self-pay | Admitting: Acute Care

## 2022-10-18 DIAGNOSIS — F17210 Nicotine dependence, cigarettes, uncomplicated: Secondary | ICD-10-CM

## 2022-10-18 DIAGNOSIS — Z122 Encounter for screening for malignant neoplasm of respiratory organs: Secondary | ICD-10-CM

## 2022-10-18 DIAGNOSIS — Z87891 Personal history of nicotine dependence: Secondary | ICD-10-CM

## 2022-11-10 ENCOUNTER — Other Ambulatory Visit: Payer: Self-pay | Admitting: Internal Medicine

## 2022-11-10 DIAGNOSIS — I10 Essential (primary) hypertension: Secondary | ICD-10-CM

## 2022-11-10 NOTE — Telephone Encounter (Signed)
Requested Prescriptions  Pending Prescriptions Disp Refills   lisinopril-hydrochlorothiazide (ZESTORETIC) 20-25 MG tablet [Pharmacy Med Name: Lisinopril-hydroCHLOROthiazide 20-25 MG Oral Tablet] 180 tablet 0    Sig: Take 2 tablets by mouth once daily     Cardiovascular:  ACEI + Diuretic Combos Passed - 11/10/2022  6:50 AM      Passed - Na in normal range and within 180 days    Sodium  Date Value Ref Range Status  06/08/2022 139 134 - 144 mmol/L Final         Passed - K in normal range and within 180 days    Potassium  Date Value Ref Range Status  06/08/2022 4.2 3.5 - 5.2 mmol/L Final         Passed - Cr in normal range and within 180 days    Creatinine, Ser  Date Value Ref Range Status  06/08/2022 1.16 0.76 - 1.27 mg/dL Final         Passed - eGFR is 30 or above and within 180 days    GFR calc Af Amer  Date Value Ref Range Status  10/17/2019 82 >59 mL/min/1.73 Final   GFR calc non Af Amer  Date Value Ref Range Status  10/17/2019 71 >59 mL/min/1.73 Final   eGFR  Date Value Ref Range Status  06/08/2022 69 >59 mL/min/1.73 Final         Passed - Patient is not pregnant      Passed - Last BP in normal range    BP Readings from Last 1 Encounters:  06/08/22 120/64         Passed - Valid encounter within last 6 months    Recent Outpatient Visits           5 months ago Essential hypertension   [facility], Jesse Sans, MD   11 months ago Annual physical exam   [place] [facility] [place], Jesse Sans, MD   1 year ago Essential hypertension   [place] [facility], Jesse Sans, MD   1 year ago Essential hypertension   [place] [facility] [place] [facility], Jesse Sans, MD   1 year ago Essential hypertension   [place] [facility], Jesse Sans, MD        Future Appointments             In 3 weeks [place] Melia Jesse Sans, MD [place] [facility]

## 2022-11-25 DIAGNOSIS — Z0100 Encounter for examination of eyes and vision without abnormal findings: Secondary | ICD-10-CM | POA: Diagnosis not present

## 2022-12-07 ENCOUNTER — Ambulatory Visit (INDEPENDENT_AMBULATORY_CARE_PROVIDER_SITE_OTHER): Payer: Medicare HMO | Admitting: Internal Medicine

## 2022-12-07 ENCOUNTER — Encounter: Payer: Self-pay | Admitting: Internal Medicine

## 2022-12-07 VITALS — BP 134/78 | HR 70 | Ht 73.0 in | Wt 205.0 lb

## 2022-12-07 DIAGNOSIS — E7800 Pure hypercholesterolemia, unspecified: Secondary | ICD-10-CM

## 2022-12-07 DIAGNOSIS — F17200 Nicotine dependence, unspecified, uncomplicated: Secondary | ICD-10-CM

## 2022-12-07 DIAGNOSIS — I77810 Thoracic aortic ectasia: Secondary | ICD-10-CM

## 2022-12-07 DIAGNOSIS — Z125 Encounter for screening for malignant neoplasm of prostate: Secondary | ICD-10-CM

## 2022-12-07 DIAGNOSIS — D6869 Other thrombophilia: Secondary | ICD-10-CM

## 2022-12-07 DIAGNOSIS — Z7901 Long term (current) use of anticoagulants: Secondary | ICD-10-CM

## 2022-12-07 DIAGNOSIS — K635 Polyp of colon: Secondary | ICD-10-CM | POA: Diagnosis not present

## 2022-12-07 DIAGNOSIS — J432 Centrilobular emphysema: Secondary | ICD-10-CM | POA: Diagnosis not present

## 2022-12-07 DIAGNOSIS — I272 Pulmonary hypertension, unspecified: Secondary | ICD-10-CM

## 2022-12-07 DIAGNOSIS — R7303 Prediabetes: Secondary | ICD-10-CM | POA: Diagnosis not present

## 2022-12-07 DIAGNOSIS — Z0000 Encounter for general adult medical examination without abnormal findings: Secondary | ICD-10-CM | POA: Diagnosis not present

## 2022-12-07 DIAGNOSIS — I10 Essential (primary) hypertension: Secondary | ICD-10-CM | POA: Diagnosis not present

## 2022-12-07 MED ORDER — LISINOPRIL-HYDROCHLOROTHIAZIDE 20-25 MG PO TABS
2.0000 | ORAL_TABLET | Freq: Every day | ORAL | 3 refills | Status: DC
Start: 2022-12-07 — End: 2023-12-06

## 2022-12-07 MED ORDER — WARFARIN SODIUM 7.5 MG PO TABS: ORAL_TABLET | ORAL | 3 refills | Status: DC

## 2022-12-07 MED ORDER — ROSUVASTATIN CALCIUM 20 MG PO TABS: 20.0000 mg | ORAL_TABLET | Freq: Every day | ORAL | 3 refills | Status: DC

## 2022-12-07 NOTE — Assessment & Plan Note (Signed)
Mild symptoms that are not limiting his activities

## 2022-12-07 NOTE — Assessment & Plan Note (Signed)
CT done 2022 showed aorta to be upper limits of normal.

## 2022-12-07 NOTE — Assessment & Plan Note (Addendum)
On Warfarin for lifetime due to hx PE. No bleeding issues - INR is not being monitored regularly

## 2022-12-07 NOTE — Progress Notes (Signed)
Date:  12/07/2022   Name:  Tyler Estes   DOB:  12-13-1955   MRN:  [id number]   Chief Complaint: Annual Exam Tyler Estes is a 67 y.o. male who presents today for his Complete Annual Exam. He feels well. He reports exercising- golfing. He reports he is sleeping fairly well.   Colonoscopy: 01/2018 repeat 10 yrs  Immunization History  Administered Date(s) Administered   Moderna Sars-Covid-2 Vaccination 11/30/2019, 12/27/2019   Pneumococcal Conjugate-13 08/06/2020   Pneumococcal Polysaccharide-23 03/02/2016   Tdap 01/28/2014   Health Maintenance Due  Topic Date Due   Zoster Vaccines- Shingrix (1 of 2) Never done   COVID-19 Vaccine (3 - 2023-24 season) 04/16/2022    Lab Results  Component Value Date   PSA1 0.8 12/03/2021   PSA1 0.6 12/11/2020   PSA1 0.7 10/17/2019    Hypertension This is a chronic problem. The problem is controlled. Pertinent negatives include no chest pain, headaches, palpitations or shortness of breath.  Hyperlipidemia This is a chronic problem. Recent lipid tests were reviewed and are high. Pertinent negatives include no chest pain, myalgias or shortness of breath. Current antihyperlipidemic treatment includes statins. The current treatment provides moderate improvement of lipids.  Diabetes He presents for his follow-up diabetic visit. Diabetes type: prediabetes. Pertinent negatives for hypoglycemia include no dizziness, headaches or nervousness/anxiousness. Pertinent negatives for diabetes include no chest pain and no fatigue.    Lab Results  Component Value Date   NA 139 06/08/2022   K 4.2 06/08/2022   CO2 22 06/08/2022   GLUCOSE 104 (H) 06/08/2022   BUN 15 06/08/2022   CREATININE 1.16 06/08/2022   CALCIUM 9.8 06/08/2022   EGFR 69 06/08/2022   GFRNONAA 71 10/17/2019   Lab Results  Component Value Date   CHOL 185 12/03/2021   HDL 62 12/03/2021   LDLCALC 101 (H) 12/03/2021   TRIG 127 12/03/2021   CHOLHDL 3.0 12/03/2021   Lab Results   Component Value Date   TSH 1.40 09/25/2013   Lab Results  Component Value Date   HGBA1C 5.8 (H) 06/08/2022   Lab Results  Component Value Date   WBC 4.2 12/03/2021   HGB 16.6 12/03/2021   HCT 46.7 12/03/2021   MCV 96 12/03/2021   PLT 167 12/03/2021   Lab Results  Component Value Date   ALT 31 12/03/2021   AST 27 12/03/2021   ALKPHOS 53 12/03/2021   BILITOT 0.4 12/03/2021   No results found for: "25OHVITD2", "25OHVITD3", "VD25OH"   Review of Systems  Constitutional:  Negative for appetite change, chills, diaphoresis, fatigue and unexpected weight change.  HENT:  Negative for hearing loss, tinnitus, trouble swallowing and voice change.   Eyes:  Negative for visual disturbance.  Respiratory:  Negative for choking, shortness of breath and wheezing.   Cardiovascular:  Negative for chest pain, palpitations and leg swelling.  Gastrointestinal:  Negative for abdominal pain, blood in stool, constipation and diarrhea.  Genitourinary:  Negative for difficulty urinating, dysuria and frequency.  Musculoskeletal:  Negative for arthralgias, back pain and myalgias.  Skin:  Negative for color change and rash.  Neurological:  Negative for dizziness, syncope and headaches.  Hematological:  Negative for adenopathy.  Psychiatric/Behavioral:  Negative for dysphoric mood and sleep disturbance. The patient is not nervous/anxious.     Patient Active Problem List   Diagnosis Date Noted   Prediabetes 02/26/2021   Spermatocele 12/11/2020   Centrilobular emphysema 09/18/2020   Ascending aorta dilatation 09/18/2020   Acquired thrombophilia 08/06/2020  Aortic atherosclerosis 10/12/2018   Anticoagulated on warfarin 10/12/2018   Benign colon polyp 01/10/2015   Hypercholesteremia 01/10/2015   Essential hypertension 01/10/2015   History of pulmonary embolus (PE) 01/10/2015   Leg varices 01/10/2015   Tobacco use disorder 01/10/2015    No Known Allergies  Past Surgical History:  Procedure  Laterality Date   COLONOSCOPY     COLONOSCOPY WITH PROPOFOL N/A 01/27/2018   Procedure: COLONOSCOPY WITH PROPOFOL;  Surgeon: Christena Deem, MD;  Location: [facility] ENDOSCOPY;  Service: Endoscopy;  Laterality: N/A;   VASCULAR SURGERY  2013   Leg   VASECTOMY      Social History   Tobacco Use   Smoking status: Every Day    Packs/day: 1.00    Years: 47.00    Additional pack years: 0.00    Total pack years: 47.00    Types: Cigarettes   Smokeless tobacco: Never   Tobacco comments:    currently .75ppd  Vaping Use   Vaping Use: Never used  Substance Use Topics   Alcohol use: Yes    Alcohol/week: 28.0 standard drinks of alcohol    Types: 28 Cans of beer per week    Comment: 4 beers a day   Drug use: No    Comment: Quit in 1997     Medication list has been reviewed and updated.  Current Meds  Medication Sig   [DISCONTINUED] lisinopril-hydrochlorothiazide (ZESTORETIC) 20-25 MG tablet Take 2 tablets by mouth once daily   [DISCONTINUED] rosuvastatin (CRESTOR) 20 MG tablet Take 1 tablet by mouth once daily   [DISCONTINUED] warfarin (COUMADIN) 7.5 MG tablet TAKE 1/2 TABLET BY MOUTH ON MONDAYs and ONE WHOLE TABLET DAILY THE REST OF THE WEEK       12/07/2022    9:15 AM 06/08/2022    8:38 AM 12/03/2021    8:38 AM 07/30/2021    8:37 AM  GAD 7 : Generalized Anxiety Score  Nervous, Anxious, on Edge 0 0 0 0  Control/stop worrying 0 0 0 0  Worry too much - different things 0 0 0 0  Trouble relaxing 0 0 0 0  Restless 0 0 0 0  Easily annoyed or irritable 0 0 0 0  Afraid - awful might happen 0 0 0 0  Total GAD 7 Score 0 0 0 0  Anxiety Difficulty Not difficult at all Not difficult at all  Not difficult at all       12/07/2022    9:15 AM 08/20/2022    3:28 PM 06/08/2022    8:37 AM  Depression screen PHQ 2/9  Decreased Interest 0 0 0  Down, Depressed, Hopeless 0 0 0  PHQ - 2 Score 0 0 0  Altered sleeping 0  0  Tired, decreased energy 1  0  Change in appetite 0  0  Feeling bad or  failure about yourself  0  0  Trouble concentrating 0  0  Moving slowly or fidgety/restless 0  0  Suicidal thoughts 0  0  PHQ-9 Score 1  0  Difficult doing work/chores Not difficult at all  Not difficult at all    BP Readings from Last 3 Encounters:  12/07/22 134/78  06/08/22 120/64  12/03/21 130/80    Physical Exam Vitals and nursing note reviewed.  Constitutional:      Appearance: Normal appearance. He is well-developed.  HENT:     Head: Normocephalic.     Right Ear: Tympanic membrane, ear canal and external ear normal.  Left Ear: Tympanic membrane, ear canal and external ear normal.     Nose: Nose normal.  Eyes:     Conjunctiva/sclera: Conjunctivae normal.     Pupils: Pupils are equal, round, and reactive to light.  Neck:     Thyroid: No thyromegaly.     Vascular: No carotid bruit.  Cardiovascular:     Rate and Rhythm: Normal rate and regular rhythm.     Heart sounds: Normal heart sounds.  Pulmonary:     Effort: Pulmonary effort is normal.     Breath sounds: Normal breath sounds. No wheezing.  Chest:  Breasts:    Right: No mass.     Left: No mass.  Abdominal:     General: Bowel sounds are normal.     Palpations: Abdomen is soft.     Tenderness: There is no abdominal tenderness.  Musculoskeletal:        General: Normal range of motion.     Cervical back: Normal range of motion and neck supple.  Lymphadenopathy:     Cervical: No cervical adenopathy.  Skin:    General: Skin is warm and dry.  Neurological:     Mental Status: He is alert and oriented to person, place, and time.     Deep Tendon Reflexes: Reflexes are normal and symmetric.  Psychiatric:        Attention and Perception: Attention normal.        Mood and Affect: Mood normal.        Thought Content: Thought content normal.     Wt Readings from Last 3 Encounters:  12/07/22 205 lb (93 kg)  06/08/22 201 lb 6.4 oz (91.4 kg)  12/03/21 199 lb (90.3 kg)    BP 134/78 (BP Location: Right Arm, Cuff  Size: Large)   Pulse 70   Ht 6\' 1"  (1.854 m)   Wt 205 lb (93 kg)   SpO2 96%   BMI 27.05 kg/m   Assessment and Plan:  Problem List Items Addressed This Visit       Cardiovascular and Mediastinum   Ascending aorta dilatation    CT done 2022 showed aorta to be upper limits of normal.      Relevant Medications   warfarin (COUMADIN) 7.5 MG tablet   lisinopril-hydrochlorothiazide (ZESTORETIC) 20-25 MG tablet   rosuvastatin (CRESTOR) 20 MG tablet   Essential hypertension (Chronic)    Clinically stable exam with well controlled BP on lisinopril hct. Tolerating medications without side effects. Pt to continue current regimen and low sodium diet.       Relevant Medications   warfarin (COUMADIN) 7.5 MG tablet   lisinopril-hydrochlorothiazide (ZESTORETIC) 20-25 MG tablet   rosuvastatin (CRESTOR) 20 MG tablet   Other Relevant Orders   CBC with Differential/Platelet   Comprehensive metabolic panel   Urinalysis, Routine w reflex microscopic     Respiratory   Centrilobular emphysema    Mild symptoms that are not limiting his activities        Digestive   Benign colon polyp     Hematopoietic and Hemostatic   Acquired thrombophilia (Chronic)    On Warfarin for lifetime due to hx PE. No bleeding issues - INR is not being monitored regularly      Relevant Orders   Protime-INR     Other   Anticoagulated on warfarin   Relevant Medications   warfarin (COUMADIN) 7.5 MG tablet   Tobacco use disorder    Participating in the lung cancer screening program. Last CT 10/2022 suggested  PHTN      Hypercholesteremia (Chronic)    Tolerating statin medications without concerns LDL is  Lab Results  Component Value Date   LDLCALC 101 (H) 12/03/2021  On Crestor 20 mg with a goal of < 70. Current dose will be adjusted if needed.       Relevant Medications   warfarin (COUMADIN) 7.5 MG tablet   lisinopril-hydrochlorothiazide (ZESTORETIC) 20-25 MG tablet   rosuvastatin (CRESTOR)  20 MG tablet   Other Relevant Orders   Lipid panel   Prediabetes (Chronic)    Controlled with diet changes Lab Results  Component Value Date   HGBA1C 5.8 (H) 06/08/2022        Relevant Orders   Hemoglobin A1c   Other Visit Diagnoses     Annual physical exam    -  Primary   Prostate cancer screening       Relevant Orders   PSA   Pulmonary hypertension       Relevant Medications   warfarin (COUMADIN) 7.5 MG tablet   lisinopril-hydrochlorothiazide (ZESTORETIC) 20-25 MG tablet   rosuvastatin (CRESTOR) 20 MG tablet   Other Relevant Orders   Ambulatory referral to Cardiology       Return in about 6 months (around 06/08/2023) for HTN, INR.   Partially dictated using Dragon software, any errors are not intentional.  Reubin Milan, MD [facility] [place], [place]

## 2022-12-07 NOTE — Assessment & Plan Note (Signed)
Clinically stable exam with well controlled BP on lisinopril hct. Tolerating medications without side effects. Pt to continue current regimen and low sodium diet.  

## 2022-12-07 NOTE — Assessment & Plan Note (Signed)
Controlled with diet changes Lab Results  Component Value Date   HGBA1C 5.8 (H) 06/08/2022

## 2022-12-07 NOTE — Assessment & Plan Note (Signed)
Tolerating statin medications without concerns LDL is  Lab Results  Component Value Date   LDLCALC 101 (H) 12/03/2021  On Crestor 20 mg with a goal of < 70. Current dose will be adjusted if needed.

## 2022-12-07 NOTE — Assessment & Plan Note (Signed)
Participating in the lung cancer screening program. Last CT 10/2022 suggested PHTN

## 2022-12-08 LAB — COMPREHENSIVE METABOLIC PANEL
ALT: 29 IU/L (ref 0–44)
AST: 22 IU/L (ref 0–40)
Albumin/Globulin Ratio: 2.4 — ABNORMAL HIGH (ref 1.2–2.2)
Albumin: 4.5 g/dL (ref 3.9–4.9)
Alkaline Phosphatase: 56 IU/L (ref 44–121)
BUN/Creatinine Ratio: 13 (ref 10–24)
BUN: 17 mg/dL (ref 8–27)
Bilirubin Total: 0.5 mg/dL (ref 0.0–1.2)
CO2: 20 mmol/L (ref 20–29)
Calcium: 9.5 mg/dL (ref 8.6–10.2)
Chloride: 103 mmol/L (ref 96–106)
Creatinine, Ser: 1.34 mg/dL — ABNORMAL HIGH (ref 0.76–1.27)
Globulin, Total: 1.9 g/dL (ref 1.5–4.5)
Glucose: 106 mg/dL — ABNORMAL HIGH (ref 70–99)
Potassium: 4 mmol/L (ref 3.5–5.2)
Sodium: 141 mmol/L (ref 134–144)
Total Protein: 6.4 g/dL (ref 6.0–8.5)
eGFR: 58 mL/min/{1.73_m2} — ABNORMAL LOW (ref >59)

## 2022-12-08 LAB — LIPID PANEL
Chol/HDL Ratio: 3 ratio (ref 0.0–5.0)
Cholesterol, Total: 184 mg/dL (ref 100–199)
HDL: 62 mg/dL (ref >39)
LDL Chol Calc (NIH): 91 mg/dL (ref 0–99)
Triglycerides: 184 mg/dL — ABNORMAL HIGH (ref 0–149)
VLDL Cholesterol Cal: 31 mg/dL (ref 5–40)

## 2022-12-08 LAB — URINALYSIS, ROUTINE W REFLEX MICROSCOPIC
Bilirubin, UA: NEGATIVE
Glucose, UA: NEGATIVE
Ketones, UA: NEGATIVE
Leukocytes,UA: NEGATIVE
Nitrite, UA: NEGATIVE
Protein,UA: NEGATIVE
RBC, UA: NEGATIVE
Specific Gravity, UA: 1.018 (ref 1.005–1.030)
Urobilinogen, Ur: 0.2 mg/dL (ref 0.2–1.0)
pH, UA: 5.5 (ref 5.0–7.5)

## 2022-12-08 LAB — HEMOGLOBIN A1C
Est. average glucose Bld gHb Est-mCnc: 117 mg/dL
Hgb A1c MFr Bld: 5.7 % — ABNORMAL HIGH (ref 4.8–5.6)

## 2022-12-08 LAB — CBC WITH DIFFERENTIAL/PLATELET
Basophils Absolute: 0 10*3/uL (ref 0.0–0.2)
Basos: 1 %
EOS (ABSOLUTE): 0.1 10*3/uL (ref 0.0–0.4)
Eos: 3 %
Hematocrit: 48.1 % (ref 37.5–51.0)
Hemoglobin: 17.3 g/dL (ref 13.0–17.7)
Immature Grans (Abs): 0 10*3/uL (ref 0.0–0.1)
Immature Granulocytes: 0 %
Lymphocytes Absolute: 1.3 10*3/uL (ref 0.7–3.1)
Lymphs: 29 %
MCH: 35 pg — ABNORMAL HIGH (ref 26.6–33.0)
MCHC: 36 g/dL — ABNORMAL HIGH (ref 31.5–35.7)
MCV: 97 fL (ref 79–97)
Monocytes Absolute: 0.5 10*3/uL (ref 0.1–0.9)
Monocytes: 10 %
Neutrophils Absolute: 2.5 10*3/uL (ref 1.4–7.0)
Neutrophils: 57 %
Platelets: 158 10*3/uL (ref 150–450)
RBC: 4.94 x10E6/uL (ref 4.14–5.80)
RDW: 13.1 % (ref 11.6–15.4)
WBC: 4.4 10*3/uL (ref 3.4–10.8)

## 2022-12-08 LAB — PROTIME-INR
INR: 2.9 — ABNORMAL HIGH (ref 0.9–1.2)
Prothrombin Time: 29 s — ABNORMAL HIGH (ref 9.1–12.0)

## 2022-12-08 LAB — PSA: Prostate Specific Ag, Serum: 0.7 ng/mL (ref 0.0–4.0)

## 2022-12-10 ENCOUNTER — Other Ambulatory Visit: Payer: Self-pay | Admitting: Internal Medicine

## 2022-12-10 DIAGNOSIS — Z7901 Long term (current) use of anticoagulants: Secondary | ICD-10-CM

## 2022-12-13 NOTE — Telephone Encounter (Signed)
Requested medication (s) are due for refill today: No  Requested medication (s) are on the active medication list: Yes  Last refill:  12/07/22  Future visit scheduled: Yes  Notes to clinic:  See request, manuel review.    Requested Prescriptions  Pending Prescriptions Disp Refills   warfarin (COUMADIN) 7.5 MG tablet [Pharmacy Med Name: Warfarin Sodium 7.5 MG Oral Tablet] 96 tablet 0    Sig: TAKE 0.5 TABLET BY MOUTH ON MONDAYS AND ONE WHOLE TABLET DAILY THE REST OF THE WEEK.     Hematology:  Anticoagulants - warfarin Failed - 12/10/2022  6:32 PM      Failed - Manual Review: If patient's warfarin is managed by Anti-Coag team, route request to them. If not, route request to the provider.      Failed - INR in normal range and within 30 days    INR  Date Value Ref Range Status  12/07/2022 2.9 (H) 0.9 - 1.2 Final    Comment:    Reference interval is for non-anticoagulated patients. Suggested INR therapeutic range for Vitamin K antagonist therapy:    Standard Dose (moderate intensity                   therapeutic range):       2.0 - 3.0    Higher intensity therapeutic range       2.5 - 3.5          Passed - HCT in normal range and within 360 days    Hematocrit  Date Value Ref Range Status  12/07/2022 48.1 37.5 - 51.0 % Final         Passed - Patient is not pregnant      Passed - Valid encounter within last 3 months    Recent Outpatient Visits           6 days ago Annual physical exam   [place] [facility] at [facility], Nyoka Cowden, MD   6 months ago Essential hypertension   [place] [facility] at [facility], Nyoka Cowden, MD   1 year ago Annual physical exam   [facility] at [facility], Nyoka Cowden, MD   1 year ago Essential hypertension   [place] [facility] at [facility], Nyoka Cowden, MD   1 year ago Essential hypertension     Primary Care & Sports Medicine at [facility], Nyoka Cowden, MD       Future Appointments             In 1 month Herbie Baltimore Piedad Climes, MD [facility] at [place]   In 5 months Judithann Graves, Nyoka Cowden, MD [facility] at [facility], [facility]

## 2022-12-27 ENCOUNTER — Other Ambulatory Visit: Payer: Self-pay | Admitting: Internal Medicine

## 2022-12-27 DIAGNOSIS — Z7901 Long term (current) use of anticoagulants: Secondary | ICD-10-CM

## 2023-02-10 ENCOUNTER — Ambulatory Visit: Payer: Medicare HMO | Attending: Cardiology | Admitting: Cardiology

## 2023-02-10 ENCOUNTER — Encounter: Payer: Self-pay | Admitting: Cardiology

## 2023-02-10 VITALS — BP 126/74 | HR 68 | Ht 72.0 in | Wt 204.2 lb

## 2023-02-10 DIAGNOSIS — E785 Hyperlipidemia, unspecified: Secondary | ICD-10-CM | POA: Diagnosis not present

## 2023-02-10 DIAGNOSIS — Z7901 Long term (current) use of anticoagulants: Secondary | ICD-10-CM | POA: Diagnosis not present

## 2023-02-10 DIAGNOSIS — I77810 Thoracic aortic ectasia: Secondary | ICD-10-CM

## 2023-02-10 DIAGNOSIS — Z86711 Personal history of pulmonary embolism: Secondary | ICD-10-CM | POA: Diagnosis not present

## 2023-02-10 DIAGNOSIS — I8392 Asymptomatic varicose veins of left lower extremity: Secondary | ICD-10-CM

## 2023-02-10 DIAGNOSIS — F17200 Nicotine dependence, unspecified, uncomplicated: Secondary | ICD-10-CM

## 2023-02-10 DIAGNOSIS — I2510 Atherosclerotic heart disease of native coronary artery without angina pectoris: Secondary | ICD-10-CM

## 2023-02-10 DIAGNOSIS — R7303 Prediabetes: Secondary | ICD-10-CM | POA: Diagnosis not present

## 2023-02-10 DIAGNOSIS — I10 Essential (primary) hypertension: Secondary | ICD-10-CM | POA: Diagnosis not present

## 2023-02-10 DIAGNOSIS — I272 Pulmonary hypertension, unspecified: Secondary | ICD-10-CM

## 2023-02-10 NOTE — Progress Notes (Signed)
.   Cardiology Office Note:  .   Date:  02/10/2023  ID:  Chaim Gatley, DOB 1956-07-29, MRN [id number] PCP: Reubin Milan, MD  [facility] Providers Cardiologist:  None     Chief Complaint  Patient presents with   Follow-up    Pulm. HTN no complaints today. Meds reviewed verbally with pt. => suggested by CTA chest along with coronary calcification.    History of Present Illness: Tyler Estes is a  67 y.o. male current heavy smoker with a PMH notable for HTN, HLD, (pre-) DM-2 (A1c 5.8), history of PE on warfarin (complicated by left lower extremity venous hypertension with engorged varicose veins, s/p left sided below-knee GSV ablation), Mild Centrilobular Emphysema, and Ascending Aortic Dilation who presents here for evaluation of PULMONARY HYPERTENSION at the request of Reubin Milan, MD.  Tyler Estes was seen on 12/07/2022 by Laurence Spates, MD for annual physical.  Noted he exercises regularly and plays golf.  Referred to cardiology due to "Pulmonary Hypertension"     Subjective   INTERVAL HISTORY Tyler Estes says that he really is feeling fine and has not had any really major any cardiac concerns.  He does indicate that his father had an MI at age 47 but ended up dying in his late 78s with mesothelioma.  He was a heavy smoker and did not eat healthy. He however says that other than smoking, he does fine he has maybe some mild exertional dyspnea if he overdoes it rushing up stairs or out the excessive heat.  He plays around of golf 3 days a week and tries to be active doing that although he does ride in the golf cart, he tries to walk around the green or where he is playing from to get some exercise.  He definitely will get short of breath going up stairs but otherwise denies any chest pain or pressure with rest or exertion.  No dyspnea with routine activity.  No PND, orthopnea with just some mild swelling and varicose vein engorgement on the left leg which is where he  had his DVT years ago.  He says that over the last 10 years or so he has made dramatic changes to his diet no longer eating any fat or fried food.  Everything he does is either grilled or air fried trying to avoid animal fats and grease.  Other than the exertional dyspnea he is pretty moist asymptomatic from a cardiac standpoint:  ROS:  Cardiovascular ROS: positive for - dyspnea on exertion and morning cough and shortness of breath, but not orthopnea or PND; not swelling, just engorgement of the left leg varicose veins negative for - chest pain, irregular heartbeat, loss of consciousness, orthopnea, palpitations, paroxysmal nocturnal dyspnea, rapid heart rate, or only lightheaded when in high heat and not drinking enough, otherwise no syncope/near syncope or TIA/amaurosis fugax, claudication  Review of Systems - Negative except exertional dyspnea, mild swelling and symptoms in the below History obtained from the patient and wife General ROS: positive for  - malaise and exercise intolerance to some extent with exertional dyspnea negative for - chills, fever, hot flashes, sleep disturbance, weight gain, or weight loss Respiratory ROS: positive for - cough, pleuritic pain, and chronic cough worse in the morning, can be associated with some shortness of breath. negative for - hemoptysis, orthopnea, pleuritic pain, sputum changes, tachypnea, or wheezing     Objective   Studies Reviewed: Marland Kitchen   EKG Interpretation Date/Time:  Thursday February 10 2023 10:11:40 EDT Ventricular Rate:  68 PR Interval:  192 QRS Duration:  86 QT Interval:  388 QTC Calculation: 412 R Axis:   24  Text Interpretation: Normal sinus rhythm Normal ECG Low voltage limb leads When compared with ECG of 04-Oct-2003 17:14, No significant change was found Confirmed by Bryan Lemma ([id number]) on 02/10/2023 10:56:25 AM    CT Chest-Lung Cancer Screening 10/14/2022: Benign appearance of the lung.  Aortic atherosclerosis with coronary  calcification noted.  Enlarged pulmonary trunk indicating possible pulmonary arterial hypertension.  Emphysema. CT Angio Chest 09/30/2020: Ascending thoracic aorta 3.8 cm.  Moderate coronary calcification in LM and LAD.  Lab Results  Component Value Date   CHOL 184 12/07/2022   HDL 62 12/07/2022   LDLCALC 91 12/07/2022   TRIG 184 (H) 12/07/2022   CHOLHDL 3.0 12/07/2022   Lab Results  Component Value Date   HGBA1C 5.7 (H) 12/07/2022     Risk Assessment/Calculations:      Physical Exam:   VS:  BP 126/74 (BP Location: Left Arm, Patient Position: Sitting, Cuff Size: Normal)   Pulse 68   Ht 6' (1.829 m)   Wt 204 lb 4 oz (92.6 kg)   SpO2 96%   BMI 27.70 kg/m    Wt Readings from Last 3 Encounters:  02/10/23 204 lb 4 oz (92.6 kg)  12/07/22 205 lb (93 kg)  06/08/22 201 lb 6.4 oz (91.4 kg)    GEN: Well nourished, well developed in no acute distress; heavy smell of cigarette smoke, somewhat hoarse raspy voice NECK: No JVD; No carotid bruits CARDIAC: Distant heart sounds with normal S1, S2; RRR, no audible murmurs, rubs, gallops RESPIRATORY: Diffuse coarse breath sounds throughout.  Mild rhonchi but no rales or wheezing.  \Normal work of breathing. ABDOMEN: Soft, non-tender, non-distended; mild truncal obesity. EXTREMITIES:  No edema; No deformity  -> from left knee medial thigh-knee area down to the upper extent of the tibial plateau there is engorged venous circulation with some evidence of perforating septal branches. => (Suggested using compression sleeve and elevation.  Lab Results  Component Value Date   CHOL 184 12/07/2022   HDL 62 12/07/2022   LDLCALC 91 12/07/2022   TRIG 184 (H) 12/07/2022   CHOLHDL 3.0 12/07/2022    EKG Interpretation Date/Time:  Thursday February 10 2023 10:11:40 EDT Ventricular Rate:  68 PR Interval:  192 QRS Duration:  86 QT Interval:  388 QTC Calculation: 412 R Axis:   24  Text Interpretation: Normal sinus rhythm Normal ECG Low voltage limb  leads When compared with ECG of 04-Oct-2003 17:14, No significant change was found Confirmed by Bryan Lemma ([id number]) on 02/10/2023 10:56:25 AM       ASSESSMENT AND PLAN: .    Plan will be to evaluate pulm hypertension with 2D echo and based on that result we can determine follow-up plans for ischemic evaluation based on elevated Coronary Calcium Score and his history of family disease.  Problem List Items Addressed This Visit       Cardiology Problems   Pulmonary hypertension, unspecified (HCC) - Primary    Read on CT scan suggest elevated PA which could be consistent with elevated PA pressures.  Could also be related to lung disease.  Recommend to complete echocardiogram to assess pulmonary pressures.  If normal, plan for no further evaluation, however there is any regional wall motion abnormalities or decreased overall function, would consider evaluating for JVD.  Treat based on echo results.  Relevant Orders   ECHOCARDIOGRAM COMPLETE   Hyperlipidemia LDL goal <100    Coronary cusp.  Had myalgias with 20 mg of atorvastatin as well as myalgias and nausea with simvastatin.  Had previously also been on pravastatin then converted to rosuvastatin.  Was recently started on rosuvastatin which he is tolerating relatively well.      Essential hypertension (Chronic)    Blood pressure was well-controlled today-stable blood pressure today on lisinopril and HCTZ      Coronary artery calcification seen on CAT scan (Chronic)    Multivessel CAD/calcification noted on recurrent CT scans.  We talked about potentially consolidating or restratification with a Coronary Calcium Score, however would like to see what her echocardiogram shows.  If gross abnormalities noted with regional wall motion, would probably err on the side of either CT angiogram versus cath, but otherwise could consider Myoview.  Currently on ACE inhibitor and statin.  Being on warfarin, not a true indication for him to be on  aspirin at this point.  Further plans based on echo results.  Would not build to use beta-blocker except for low doses because of resting bradycardia.      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  Provide asymptomatic swollen varicose vein, recommend compression sleeve for support stockings based on location.  Also continue mobility. Less likely to thrombosed because of being on warfarin.      Ascending aorta dilatation (HCC) (Chronic)    Pretty much read as relatively normal by recent CT scan-3.8 cm.  Abdominal normal.  Continue to monitor, treat BP and other cardiovascular risk.  Is being followed annually by his screening chest CT for longstanding smoking history.      Relevant Orders   ECHOCARDIOGRAM COMPLETE     Other   Tobacco use disorder    I discussed with him that while they are doing annual cancer screening, his risk of CAD is notably higher with him being a smoker, diabetes coronary arteries appear to be calcified at baseline indicate presence of  Plan to start with 2D echo to assess function, and based on that result would consider ETT versus Coronary CTA      Prediabetes (Chronic)   History of pulmonary embolus (PE) (Chronic)    Prior history of PE as well as long smoking history with likely emphysema could easily explain some increases in PA pressures.  He has had multiple CT scans, none of which have been indicated possibly increased pressure we will order carotid Dopplers  On warfarin.  We discussed potentially switching over to a DOAC, his biggest issue is financial.  As long as he is tolerating, okay to consider staying on warfarin as long as he has no bleeding issues.      Anticoagulated on warfarin (Chronic)    Tolerating well.  No bleeding issues.               Dispo: Return in about 6 weeks (around 03/24/2023).  Total time spent: ~31 min spent with patient + 32 min spent charting  = 63 min  Signed,  Marykay Lex, MD, MS Bryan Lemma, M.D., M.S. Interventional Cardiologist  [facility]   [address] [place], [place]  [address] [phone]           Fax [phone]

## 2023-02-10 NOTE — Assessment & Plan Note (Signed)
Prior history of PE as well as long smoking history with likely emphysema could easily explain some increases in PA pressures.  He has had multiple CT scans, none of which have been indicated possibly increased pressure we will order carotid Dopplers  On warfarin.  We discussed potentially switching over to a DOAC, his biggest issue is financial.  As long as he is tolerating, okay to consider staying on warfarin as long as he has no bleeding issues.

## 2023-02-10 NOTE — Assessment & Plan Note (Signed)
Read on CT scan suggest elevated PA which could be consistent with elevated PA pressures.  Could also be related to lung disease.  Recommend to complete echocardiogram to assess pulmonary pressures.  If normal, plan for no further evaluation, however there is any regional wall motion abnormalities or decreased overall function, would consider evaluating for JVD.  Treat based on echo results.

## 2023-02-10 NOTE — Assessment & Plan Note (Signed)
Coronary cusp.  Had myalgias with 20 mg of atorvastatin as well as myalgias and nausea with simvastatin.  Had previously also been on pravastatin then converted to rosuvastatin.  Was recently started on rosuvastatin which he is tolerating relatively well.

## 2023-02-10 NOTE — Assessment & Plan Note (Addendum)
Blood pressure was well-controlled today-stable blood pressure today on lisinopril and HCTZ

## 2023-02-10 NOTE — Assessment & Plan Note (Signed)
Pretty much read as relatively normal by recent CT scan-3.8 cm.  Abdominal normal.  Continue to monitor, treat BP and other cardiovascular risk.  Is being followed annually by his screening chest CT for longstanding smoking history.

## 2023-02-10 NOTE — Assessment & Plan Note (Signed)
I discussed with him that while they are doing annual cancer screening, his risk of CAD is notably higher with him being a smoker, diabetes coronary arteries appear to be calcified at baseline indicate presence of  Plan to start with 2D echo to assess function, and based on that result would consider ETT versus Coronary CTA

## 2023-02-10 NOTE — Patient Instructions (Signed)
Medication Instructions:  Your physician recommends that you continue on your current medications as directed. Please refer to the Current Medication list given to you today.  *If you need a refill on your cardiac medications before your next appointment, please call your pharmacy*   Lab Work: none If you have labs (blood work) drawn today and your tests are completely normal, you will receive your results only by: MyChart Message (if you have MyChart) OR A paper copy in the mail If you have any lab test that is abnormal or we need to change your treatment, we will call you to review the results.   Testing/Procedures: Your physician has requested that you have an echocardiogram. Echocardiography is a painless test that uses sound waves to create images of your heart. It provides your doctor with information about the size and shape of your heart and how well your heart's chambers and valves are working.   You may receive an ultrasound enhancing agent through an IV if needed to better visualize your heart during the echo. This procedure takes approximately one hour.  There are no restrictions for this procedure.  This will take place at [address] [address], [place] [address]    Follow-Up: At [facility], you and your health needs are our priority.  As part of our continuing mission to provide you with exceptional heart care, we have created designated Provider Care Teams.  These Care Teams include your primary Cardiologist (physician) and Advanced Practice Providers (APPs -  Physician Assistants and Nurse Practitioners) who all work together to provide you with the care you need, when you need it.  We recommend signing up for the patient portal called "MyChart".  Sign up information is provided on this After Visit Summary.  MyChart is used to connect with patients for Virtual Visits (Telemedicine).  Patients are able to view lab/test results, encounter  notes, upcoming appointments, etc.  Non-urgent messages can be sent to your provider as well.   To learn more about what you can do with MyChart, go to [email or web address].    Your next appointment:   Follow up after Echo   Provider:   You may see Bryan Lemma, MD or one of the following Advanced Practice Providers on your designated Care Team:   Nicolasa Ducking, NP Eula Listen, PA-C Cadence Fransico Michael, PA-C Charlsie Quest, NP

## 2023-02-10 NOTE — Assessment & Plan Note (Signed)
Tolerating well.  No bleeding issues.

## 2023-02-10 NOTE — Assessment & Plan Note (Signed)
Multivessel CAD/calcification noted on recurrent CT scans.  We talked about potentially consolidating or restratification with a Coronary Calcium Score, however would like to see what her echocardiogram shows.  If gross abnormalities noted with regional wall motion, would probably err on the side of either CT angiogram versus cath, but otherwise could consider Myoview.  Currently on ACE inhibitor and statin.  Being on warfarin, not a true indication for him to be on aspirin at this point.  Further plans based on echo results.  Would not build to use beta-blocker except for low doses because of resting bradycardia.

## 2023-02-10 NOTE — Assessment & Plan Note (Signed)
Status post left lower GSV ablation.  Provide asymptomatic swollen varicose vein, recommend compression sleeve for support stockings based on location.  Also continue mobility. Less likely to thrombosed because of being on warfarin.

## 2023-02-24 ENCOUNTER — Ambulatory Visit: Payer: Medicare HMO | Attending: Cardiology

## 2023-02-24 DIAGNOSIS — I77810 Thoracic aortic ectasia: Secondary | ICD-10-CM | POA: Diagnosis not present

## 2023-02-24 DIAGNOSIS — I272 Pulmonary hypertension, unspecified: Secondary | ICD-10-CM | POA: Diagnosis not present

## 2023-02-25 LAB — ECHOCARDIOGRAM COMPLETE
Area-P 1/2: 3.99 cm2
S' Lateral: 2.4 cm

## 2023-03-24 ENCOUNTER — Encounter: Payer: Self-pay | Admitting: Cardiology

## 2023-03-24 ENCOUNTER — Ambulatory Visit: Payer: Medicare HMO | Attending: Cardiology | Admitting: Cardiology

## 2023-03-24 VITALS — BP 148/74 | HR 64 | Ht 72.0 in | Wt 204.4 lb

## 2023-03-24 DIAGNOSIS — E785 Hyperlipidemia, unspecified: Secondary | ICD-10-CM

## 2023-03-24 DIAGNOSIS — F17200 Nicotine dependence, unspecified, uncomplicated: Secondary | ICD-10-CM

## 2023-03-24 DIAGNOSIS — I2510 Atherosclerotic heart disease of native coronary artery without angina pectoris: Secondary | ICD-10-CM | POA: Diagnosis not present

## 2023-03-24 DIAGNOSIS — I10 Essential (primary) hypertension: Secondary | ICD-10-CM

## 2023-03-24 DIAGNOSIS — I272 Pulmonary hypertension, unspecified: Secondary | ICD-10-CM

## 2023-03-24 DIAGNOSIS — I77810 Thoracic aortic ectasia: Secondary | ICD-10-CM

## 2023-03-24 DIAGNOSIS — Z86711 Personal history of pulmonary embolism: Secondary | ICD-10-CM

## 2023-03-24 NOTE — Patient Instructions (Signed)
Medication Instructions:  Your physician recommends that you continue on your current medications as directed. Please refer to the Current Medication list given to you today.  *If you need a refill on your cardiac medications before your next appointment, please call your pharmacy*   Lab Work: none If you have labs (blood work) drawn today and your tests are completely normal, you will receive your results only by: MyChart Message (if you have MyChart) OR A paper copy in the mail If you have any lab test that is abnormal or we need to change your treatment, we will call you to review the results.   Testing/Procedures: none   Follow-Up: At [facility], you and your health needs are our priority.  As part of our continuing mission to provide you with exceptional heart care, we have created designated Provider Care Teams.  These Care Teams include your primary Cardiologist (physician) and Advanced Practice Providers (APPs -  Physician Assistants and Nurse Practitioners) who all work together to provide you with the care you need, when you need it.  We recommend signing up for the patient portal called "MyChart".  Sign up information is provided on this After Visit Summary.  MyChart is used to connect with patients for Virtual Visits (Telemedicine).  Patients are able to view lab/test results, encounter notes, upcoming appointments, etc.  Non-urgent messages can be sent to your provider as well.   To learn more about what you can do with MyChart, go to [email or web address].    Your next appointment:   3 month(s)  Provider:   Bryan Lemma, MD

## 2023-03-24 NOTE — Progress Notes (Signed)
Cardiology Office Note:  .   Date:  03/24/2023  ID:  Tyler Estes, DOB 01-22-1956, MRN [id number] PCP: Reubin Milan, MD  [facility] Providers Cardiologist:  None    History of Present Illness: Tyler Estes is a 67 y.o. male with past medical history of tobacco abuse, hypertension, hyperlipidemia, prediabetes, history of pulmonary embolism on warfarin (complicated by left lower extremity engorged varicose vein, status post left-sided GSV ablation), emphysema, ascending aortic dilatation, pulmonary hypertension, coronary artery calcification seen on CT, who presents today for follow-up.  CT angio of the chest in 09/30/2020 revealed ascending thoracic aorta 3.8 cm, moderate coronary calcification left main and LAD.  CT of the chest lung cancer screening completed 10/14/2022 revealed benign appearance of the lung, aortic atherosclerosis with coronary calcification noted, enlarged pulmonary trunk indicating possible pulmonary arterial hypertension, and emphysema.  He was last seen in clinic 02/10/2023 by Dr. Herbie Baltimore for concern of pulmonary hypertension suggested by CTA.  At that time he does find has some occasional mild exertional dyspnea if he over exerts himself that is worse in excessive heat.  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  He definitely gets short of breath going up stairs but otherwise denies any chest pain or pressure on exertion.  He is scheduled for an echocardiogram.  He returns to clinic today stating that overall he has been doing fairly well.  Blood pressure is slightly elevated today after coming in and the horrible storms.  He stated that he had had his echocardiogram completed and had tried to read what the report stated but was unsure of what he was looking at.  He denies any chest pain, palpitations, peripheral edema, or worsening shortness of breath.  Denies any hospitalizations or visits to  the emergency department.  Also denies any bleeding or noted of blood in his urine or stool as being on chronic anticoagulant with warfarin.  ROS: 10 point review of systems completed and considered negative except what is been listed in the HPI  Studies Reviewed: Marland Kitchen       TTE 02/24/23 1. Left ventricular ejection fraction, by estimation, is 60 to 65%. The  left ventricle has normal function. The left ventricle has no regional  wall motion abnormalities. Left ventricular diastolic parameters are  consistent with Grade I diastolic  dysfunction (impaired relaxation). The average left ventricular global  longitudinal strain is -19.1 %.   2. Right ventricular systolic function is normal. The right ventricular  size is normal. Tricuspid regurgitation signal is inadequate for assessing  PA pressure.   3. The mitral valve is normal in structure. Mild mitral valve  regurgitation. No evidence of mitral stenosis.   4. The aortic valve is tricuspid. Aortic valve regurgitation is mild. No  aortic stenosis is present.   5. There is mild dilatation of the ascending aorta, measuring 40 mm.   6. The inferior vena cava is normal in size with greater than 50%  respiratory variability, suggesting right atrial pressure of 3 mmHg.  Risk Assessment/Calculations:     HYPERTENSION CONTROL Vitals:   03/24/23 0913 03/24/23 0930  BP: (!) 150/80 (!) 148/74    The patient's blood pressure is elevated above target today.  In order to address the patient's elevated BP: Blood pressure will be monitored at home to determine if medication changes need to be made.  Physical Exam:   VS:  BP (!) 148/74 (BP Location: Left Arm, Patient Position: Sitting, Cuff Size: Normal)   Pulse 64   Ht 6' (1.829 m)   Wt 204 lb 6 oz (92.7 kg)   SpO2 94%   BMI 27.72 kg/m    Wt Readings from Last 3 Encounters:  03/24/23 204 lb 6 oz (92.7 kg)  02/10/23 204 lb 4 oz (92.6 kg)  12/07/22 205 lb (93 kg)    GEN: Well  nourished, well developed in no acute distress NECK: No JVD; No carotid bruits CARDIAC: RRR, no murmurs, rubs, gallops RESPIRATORY:  Clear to auscultation without rales, wheezing or rhonchi  ABDOMEN: Soft, non-tender, non-distended EXTREMITIES:  No edema; No deformity   ASSESSMENT AND PLAN: .   Coronary artery calcification seen on CT scan which revealed multivessel CAD/calcification noted on recurrent CT scans.  Echocardiogram was completed which shows an LVEF 60 to 65%, no regional wall motion abnormalities, mild mitral regurgitation.  Denies any anginal symptoms.  On return can consider repeat discussing restratification with coronary calcium score.  He is continued on rosuvastatin 20 mg daily and warfarin and lieu of aspirin.  Essential hypertension with blood pressure today 150/80 and recheck of 148/74.  Blood pressure slightly elevated today on the treadmill and in bad weather.  He is continued on lisinopril HCTZ 20/25 mg 2 tablets daily.  Requested refills today.  Hyperlipidemia with an LDL goal of less than 100.  Had myalgias with 20 mg of atorvastatin as well as nausea with simvastatin.  He had previously been on pravastatin converted to rosuvastatin.  He is continued on rosuvastatin 20 mg daily without any complaints.  This continues to be managed by his PCP.  Ascending aorta dilatation originally noted on CT scan at 3.8 cm.  Echocardiogram ascending aorta measured 40 mm.  This is being followed annually by screening chest CT for longstanding smoking history.  Previous concern of pulmonary hypertension noted on CT scan.  No note of elevated pressures on echocardiogram to suggest pulmonary hypertension.  May likely be related to lung disease.  No further evaluation needed at this time as there are no wall motion abnormalities or decreased function.  Tobacco abuse with cessation recommended  History of PE and DVT.  Continued on warfarin.  He has never been switched to DOAC due to  financial concerns.  He continues to tolerate with no bleeding at this time.       Dispo: Patient to return to clinic to see MD/APP in 3 months or sooner to reevaluate symptoms if needed.  Signed, Micah Galeno, NP

## 2023-03-28 ENCOUNTER — Other Ambulatory Visit: Payer: Self-pay | Admitting: Internal Medicine

## 2023-03-28 DIAGNOSIS — Z7901 Long term (current) use of anticoagulants: Secondary | ICD-10-CM

## 2023-06-07 ENCOUNTER — Ambulatory Visit (INDEPENDENT_AMBULATORY_CARE_PROVIDER_SITE_OTHER): Payer: Medicare HMO | Admitting: Internal Medicine

## 2023-06-07 ENCOUNTER — Encounter: Payer: Self-pay | Admitting: Internal Medicine

## 2023-06-07 VITALS — BP 128/76 | HR 77 | Ht 72.0 in | Wt 205.0 lb

## 2023-06-07 DIAGNOSIS — I10 Essential (primary) hypertension: Secondary | ICD-10-CM | POA: Diagnosis not present

## 2023-06-07 DIAGNOSIS — Z86711 Personal history of pulmonary embolism: Secondary | ICD-10-CM

## 2023-06-07 DIAGNOSIS — D485 Neoplasm of uncertain behavior of skin: Secondary | ICD-10-CM | POA: Insufficient documentation

## 2023-06-07 NOTE — Assessment & Plan Note (Signed)
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 Cost is an issue so he continues on warfarin; no bleeding issues Lab Results  Component Value Date   INR 2.9 (H) 12/07/2022   INR 3.4 (H) 06/08/2022   INR 3.0 (H) 12/03/2021   PROTIME 24.5 (A) 12/18/2014

## 2023-06-07 NOTE — Progress Notes (Signed)
Date:  06/07/2023   Name:  Tyler Estes   DOB:  1955/12/16   MRN:  [id number]   Chief Complaint: Hypertension and Coagulation Disorder  Hypertension This is a chronic problem. The problem is controlled. Pertinent negatives include no chest pain, headaches, palpitations or shortness of breath. Past treatments include ACE inhibitors and diuretics. The current treatment provides significant improvement. Hypertensive end-organ damage includes kidney disease. There is no history of CAD/MI or CVA.  PE on anti-coagulation - on warfarin and doing well.  No issues with side effects/bleeding or compliance. Lesion on right neck - started as a pimple that he manipulated and some drainage was expressed.  Then the lesion enlarged and is now raised but not draining and not tender.  Review of Systems  Constitutional:  Negative for fatigue and unexpected weight change.  HENT:  Negative for nosebleeds.   Eyes:  Negative for visual disturbance.  Respiratory:  Negative for cough, chest tightness, shortness of breath and wheezing.   Cardiovascular:  Negative for chest pain, palpitations and leg swelling.  Gastrointestinal:  Negative for abdominal pain, constipation and diarrhea.  Neurological:  Negative for dizziness, weakness, light-headedness and headaches.     Lab Results  Component Value Date   NA 141 12/07/2022   K 4.0 12/07/2022   CO2 20 12/07/2022   GLUCOSE 106 (H) 12/07/2022   BUN 17 12/07/2022   CREATININE 1.34 (H) 12/07/2022   CALCIUM 9.5 12/07/2022   EGFR 58 (L) 12/07/2022   GFRNONAA 71 10/17/2019   Lab Results  Component Value Date   CHOL 184 12/07/2022   HDL 62 12/07/2022   LDLCALC 91 12/07/2022   TRIG 184 (H) 12/07/2022   CHOLHDL 3.0 12/07/2022   Lab Results  Component Value Date   TSH 1.40 09/25/2013   Lab Results  Component Value Date   HGBA1C 5.7 (H) 12/07/2022   Lab Results  Component Value Date   WBC 4.4 12/07/2022   HGB 17.3 12/07/2022   HCT 48.1 12/07/2022    MCV 97 12/07/2022   PLT 158 12/07/2022   Lab Results  Component Value Date   ALT 29 12/07/2022   AST 22 12/07/2022   ALKPHOS 56 12/07/2022   BILITOT 0.5 12/07/2022   Lab Results  Component Value Date   INR 2.9 (H) 12/07/2022   INR 3.4 (H) 06/08/2022   INR 3.0 (H) 12/03/2021   PROTIME 24.5 (A) 12/18/2014    No results found for: "25OHVITD2", "25OHVITD3", "VD25OH"   Patient Active Problem List   Diagnosis Date Noted   Neoplasm of uncertain behavior of skin 06/07/2023   Coronary artery calcification seen on CAT scan 02/10/2023   Pulmonary hypertension, unspecified (HCC) 02/10/2023   Prediabetes 02/26/2021   Spermatocele 12/11/2020   Centrilobular emphysema (HCC) 09/18/2020   Ascending aorta dilatation (HCC) 09/18/2020   Acquired thrombophilia (HCC) 08/06/2020   Aortic atherosclerosis (HCC) 10/12/2018   Anticoagulated on warfarin 10/12/2018   Benign colon polyp 01/10/2015   Hyperlipidemia LDL goal <100 01/10/2015   Essential hypertension 01/10/2015   History of pulmonary embolus (PE) 01/10/2015   Asymptomatic superficial varicose vein of left lower extremity 01/10/2015   Tobacco use disorder 01/10/2015    No Known Allergies  Past Surgical History:  Procedure Laterality Date   COLONOSCOPY     COLONOSCOPY WITH PROPOFOL N/A 01/27/2018   Procedure: COLONOSCOPY WITH PROPOFOL;  Surgeon: Christena Deem, MD;  [facility] END[facility]COPY;  Service: Endoscopy;  Laterality: N/A;   VASCULAR SURGERY  2013  Leg   VASECTOMY      Social History   Tobacco Use   Smoking status: Every Day    Current packs/day: 1.00    Average packs/day: 1 pack/day for 47.0 years (47.0 ttl pk-yrs)    Types: Cigarettes   Smokeless tobacco: Never   Tobacco comments:    currently .75ppd  Vaping Use   Vaping status: Never Used  Substance Use Topics   Alcohol use: Yes    Alcohol/week: 28.0 standard drinks of alcohol    Types: 28 Cans of beer per week    Comment: 4 beers a day   Drug use:  No    Comment: Quit in 1997     Medication list has been reviewed and updated.  Current Meds  Medication Sig   lisinopril-hydrochlorothiazide (ZESTORETIC) 20-25 MG tablet Take 2 tablets by mouth daily.   rosuvastatin (CRESTOR) 20 MG tablet Take 1 tablet (20 mg total) by mouth daily.   warfarin (COUMADIN) 7.5 MG tablet TAKE ONE-HALF TABLET BY MOUTH ON MONDAYS AND 1 TABET DAILY THE REST OF THE WEEK       06/07/2023    9:35 AM 12/07/2022    9:15 AM 06/08/2022    8:38 AM 12/03/2021    8:38 AM  GAD 7 : Generalized Anxiety Score  Nervous, Anxious, on Edge 0 0 0 0  Control/stop worrying 0 0 0 0  Worry too much - different things 0 0 0 0  Trouble relaxing 0 0 0 0  Restless 0 0 0 0  Easily annoyed or irritable 0 0 0 0  Afraid - awful might happen 0 0 0 0  Total GAD 7 Score 0 0 0 0  Anxiety Difficulty Not difficult at all Not difficult at all Not difficult at all        06/07/2023    9:35 AM 12/07/2022    9:15 AM 08/20/2022    3:28 PM  Depression screen PHQ 2/9  Decreased Interest 0 0 0  Down, Depressed, Hopeless 0 0 0  PHQ - 2 Score 0 0 0  Altered sleeping 0 0   Tired, decreased energy 0 1   Change in appetite 0 0   Feeling bad or failure about yourself  0 0   Trouble concentrating 0 0   Moving slowly or fidgety/restless 0 0   Suicidal thoughts 0 0   PHQ-9 Score 0 1   Difficult doing work/chores Not difficult at all Not difficult at all     BP Readings from Last 3 Encounters:  06/07/23 128/76  03/24/23 (!) 148/74  02/10/23 126/74    Physical Exam Vitals and nursing note reviewed.  Constitutional:      General: He is not in acute distress.    Appearance: He is well-developed.  HENT:     Head: Normocephalic and atraumatic.  Pulmonary:     Effort: Pulmonary effort is normal. No respiratory distress.  Skin:    General: Skin is warm and dry.     Findings: Lesion present. No rash.     Comments: Firm mobile non tender lesion on right neck  Neurological:     Mental  Status: He is alert and oriented to person, place, and time.  Psychiatric:        Mood and Affect: Mood normal.        Behavior: Behavior normal.     Wt Readings from Last 3 Encounters:  06/07/23 205 lb (93 kg)  03/24/23 204 lb 6 oz (92.7  kg)  02/10/23 204 lb 4 oz (92.6 kg)    BP 128/76 (BP Location: Right Arm, Cuff Size: Large)   Pulse 77   Ht 6' (1.829 m)   Wt 205 lb (93 kg)   SpO2 96%   BMI 27.80 kg/m   Assessment and Plan:  Problem List Items Addressed This Visit       Unprioritized   Essential hypertension - Primary (Chronic)    Normal exam with stable BP on lisinopril and hctz. No concerns or side effects to current medication. No change in regimen; continue low sodium diet.       Relevant Orders   Basic metabolic panel   History of pulmonary embolus (PE) (Chronic)    Recurrent PE - to stay on anticoagluation for life. 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    Reubin Milan, MD [facility] [place]

## 2023-06-07 NOTE — Assessment & Plan Note (Signed)
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

## 2023-06-07 NOTE — Assessment & Plan Note (Signed)
Normal exam with stable BP on lisinopril and hctz. No concerns or side effects to current medication. No change in regimen; continue low sodium diet.

## 2023-06-08 LAB — BASIC METABOLIC PANEL
BUN/Creatinine Ratio: 14 (ref 10–24)
BUN: 15 mg/dL (ref 8–27)
CO2: 21 mmol/L (ref 20–29)
Calcium: 9.4 mg/dL (ref 8.6–10.2)
Chloride: 103 mmol/L (ref 96–106)
Creatinine, Ser: 1.08 mg/dL (ref 0.76–1.27)
Glucose: 98 mg/dL (ref 70–99)
Potassium: 3.8 mmol/L (ref 3.5–5.2)
Sodium: 140 mmol/L (ref 134–144)
eGFR: 75 mL/min/{1.73_m2} (ref >59)

## 2023-06-08 LAB — PROTIME-INR
INR: 3.1 — ABNORMAL HIGH (ref 0.9–1.2)
Prothrombin Time: 31.6 s — ABNORMAL HIGH (ref 9.1–12.0)

## 2023-06-16 ENCOUNTER — Telehealth: Payer: Self-pay | Admitting: Internal Medicine

## 2023-06-16 NOTE — Telephone Encounter (Signed)
Called pt he is aware.  KP

## 2023-06-16 NOTE — Telephone Encounter (Signed)
Patient has called and states that Dr Judithann Graves referred him to [facility] and he states they called him to schedule but their next appointment is not until September of 2025, next year, and patient states he can not wait until next year. Patient would like his referral sent over to [facility] at [facility] located at [address] in [place]. Please follow back up with the patient @ # [phone].

## 2023-06-16 NOTE — Telephone Encounter (Signed)
Reached out to referrals team.  KP

## 2023-06-22 ENCOUNTER — Other Ambulatory Visit: Payer: Self-pay | Admitting: Internal Medicine

## 2023-06-22 DIAGNOSIS — Z7901 Long term (current) use of anticoagulants: Secondary | ICD-10-CM

## 2023-06-23 NOTE — Telephone Encounter (Signed)
Requested Prescriptions  Pending Prescriptions Disp Refills   warfarin (COUMADIN) 7.5 MG tablet [Pharmacy Med Name: Warfarin Sodium 7.5 MG Oral Tablet] 84 tablet 0    Sig: TAKE 1/2 TABLET BY MOUTH ON MONDAYS. THEN TAKE 1 TABLET DAILY THE REST OF THE WEEK.     Hematology:  Anticoagulants - warfarin Failed - 06/22/2023 10:16 AM      Failed - Manual Review: If patient's warfarin is managed by Anti-Coag team, route request to them. If not, route request to the provider.      Failed - INR in normal range and within 30 days    INR  Date Value Ref Range Status  06/07/2023 3.1 (H) 0.9 - 1.2 Final    Comment:    Reference interval is for non-anticoagulated patients. Suggested INR therapeutic range for Vitamin K antagonist therapy:    Standard Dose (moderate intensity                   therapeutic range):       2.0 - 3.0    Higher intensity therapeutic range       2.5 - 3.5          Passed - HCT in normal range and within 360 days    Hematocrit  Date Value Ref Range Status  12/07/2022 48.1 37.5 - 51.0 % Final         Passed - Patient is not pregnant      Passed - Valid encounter within last 3 months    Recent Outpatient Visits           2 weeks ago Essential hypertension   [place] [facility] at [facility], Nyoka Cowden, MD   6 months ago Annual physical exam   [facility] at [facility], Nyoka Cowden, MD   1 year ago Essential hypertension   [place] [facility] at [facility], Nyoka Cowden, MD   1 year ago Annual physical exam   [facility] at [facility], Nyoka Cowden, MD   1 year ago Essential hypertension   [place] [facility] at [facility], Nyoka Cowden, MD       Future Appointments             In 1 week [place], Gavin Pound, NP [facility] [place]   In 5 months Judithann Graves, Nyoka Cowden,  MD [facility] at [facility]   In 10 months Deirdre Evener, MD [facility]

## 2023-06-26 NOTE — Progress Notes (Unsigned)
Cardiology Clinic Note   Date: 06/30/2023 ID: Tyler Estes, DOB 01-01-1956, MRN [id number]  Primary Cardiologist:  Bryan Lemma, MD  Patient Profile    Tyler Estes is a 67 y.o. male who presents to the clinic today for routine follow up.     Past medical history significant for: Coronary artery calcification. Seen on CT. Ascending aortic dilatation. Echo 02/24/2023: EF 60 to 65%.  No RWMA.  Grade I DD.  Normal RV size/function.  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  Mild dilatation of ascending aorta 40 mm. Pulmonary hypertension. Hypertension. Hyperlipidemia. Lipid panel 12/07/2022: LDL 91, HDL 62, TG 184, total 184. Emphysema. 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 Tobacco abuse.  In summary, February 2022 revealed ascending thoracic aorta 3.8 cm, moderate coronary calcification left main and LAD.  CT chest (lung cancer screening) February 2024 showed benign appearance of the lung, aortic atherosclerosis with coronary calcification, enlarged pulmonary trunk indicating possible pulmonary arterial hypertension, emphysema.  Patient also with history of PE on Coumadin.  Was complicated by left lower extremity engorged varicose vein for which he underwent GSV ablation.  Patient was first evaluated by Dr. Herbie Baltimore on 02/10/2023 for pulmonary hypertension at the request of Dr. Judithann Graves.  No complaints at the time of his visit.  He reported family history of heart disease in father who had an MI at age 39 and died with mesothelioma in his late 43s.  He reported dyspnea with heavier exertion or excessive heat.  Echo was ordered as detailed above.     History of Present Illness    Tyler Estes is followed by Dr. Herbie Baltimore for the above outlined history.  Patient was last seen in the office by Charlsie Quest, NP on 03/24/2023 for follow-up after testing.  He was doing well at that time.  Hypertension was slightly elevated and attributed to the bad weather.  No medication changes were made.  Discussed  the use of AI scribe software for clinical note transcription with the patient, who gave verbal consent to proceed.  The patient presents for routine follow-up. Patient denies shortness of breath, lower extremity edema, orthopnea or PND. No chest pain, pressure, or tightness. No palpitations.  He does get dyspnea with heavier exertion. He can walk around the hospital three times without issue, but any 'hustling' causes him to become winded. He smokes a pack a day and expresses ambivalence about quitting entirely.  The patient is physically active, playing golf three times a week and doing yard work. He rides in the golf cart but also does a lot of walking on the golf course. He has considered adding more walking for exercise but has not gotten started. His mailbox is a quarter mile away on a gravel road and he has considered walking that to get started in a regular routine. 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 He reports making healthy dietary choices, avoiding greasy foods and using an air fryer for cooking.        ROS: All other systems reviewed and are otherwise negative except as noted in History of Present Illness.  Studies Reviewed    EKG Interpretation Date/Time:  Thursday June 30 2023 09:59:26 EST Ventricular Rate:  69 PR Interval:  194 QRS Duration:  74 QT Interval:  386 QTC Calculation: 413 R Axis:   43  Text Interpretation: Normal sinus rhythm Nonspecific T wave abnormality When compared with ECG of 10-Feb-2023 10:11, No significant change was found Confirmed by Hilliard Borges,  Gavin Pound [phone]) on 06/30/2023 10:02:26 AM   Physical Exam    VS:  BP (!) 140/78 (BP Location: Left Arm, Patient Position: Sitting, Cuff Size: Normal)   Pulse 69   Ht 6' (1.829 m)   Wt 208 lb 6.4 oz (94.5 kg)   SpO2 96%   BMI 28.26 kg/m  , BMI Body mass index is 28.26 kg/m.  GEN: Well nourished, well developed, in no acute distress. Neck:  No JVD or carotid bruits. Cardiac:  RRR. No murmurs. No rubs or gallops.   Respiratory:  Respirations regular and unlabored. Clear to auscultation without rales, wheezing or rhonchi. GI: Soft, nontender, nondistended. Extremities: Radials/DP/PT 2+ and equal bilaterally. No clubbing or cyanosis. No edema.  Skin: Warm and dry, no rash. Neuro: Strength intact.  Assessment & Plan      Coronary artery calcification Seen on CT.  Patient denies chest pain, pressure, tightness. Active golfing 3 times a week and doing yard work.  -No further workup indicated at this time.  Ascending aortic dilatation Echo July 2024 showed mild dilatation of ascending aorta 40 mm.  Patient denies chest pain, back pain, shortness of breath, DOE, lightheadedness, dizziness. -Continue to monitor.  Hypertension BP today 140/78 on intake and 130/84 on my recheck. Patient denies headaches, dizziness or blurred vision.  -Continue Zestoretic.  Hyperlipidemia LDL April 2024 91, at goal. -Continue rosuvastatin. -Followed by PCP.  History of PE/DVT Patient denies spontaneous bleeding concerns. -Continue Coumadin managed by PCP.  Tobacco Abuse Smokes 1 pack of cigarettes a day. Ambivalent about quitting. Discussed the benefits of smoking cessation.  -Encouraged to consider starting to cut back smoking.       Disposition: Return in 6 months or sooner as needed.          Signed, Etta Grandchild. Ahava Kissoon, DNP, NP-C

## 2023-06-30 ENCOUNTER — Ambulatory Visit: Payer: Medicare HMO | Admitting: Cardiology

## 2023-06-30 ENCOUNTER — Encounter: Payer: Self-pay | Admitting: Student

## 2023-06-30 ENCOUNTER — Ambulatory Visit: Payer: Medicare HMO | Attending: Student | Admitting: Student

## 2023-06-30 VITALS — BP 130/84 | HR 69 | Ht 72.0 in | Wt 208.4 lb

## 2023-06-30 DIAGNOSIS — E785 Hyperlipidemia, unspecified: Secondary | ICD-10-CM

## 2023-06-30 DIAGNOSIS — Z86711 Personal history of pulmonary embolism: Secondary | ICD-10-CM

## 2023-06-30 DIAGNOSIS — I77810 Thoracic aortic ectasia: Secondary | ICD-10-CM

## 2023-06-30 DIAGNOSIS — I10 Essential (primary) hypertension: Secondary | ICD-10-CM

## 2023-06-30 DIAGNOSIS — Z720 Tobacco use: Secondary | ICD-10-CM

## 2023-06-30 DIAGNOSIS — I2510 Atherosclerotic heart disease of native coronary artery without angina pectoris: Secondary | ICD-10-CM

## 2023-06-30 DIAGNOSIS — Z86718 Personal history of other venous thrombosis and embolism: Secondary | ICD-10-CM

## 2023-06-30 NOTE — Patient Instructions (Signed)
Medication Instructions:  Your Physician recommend you continue on your current medication as directed.    *If you need a refill on your cardiac medications before your next appointment, please call your pharmacy*   Lab Work: None   Follow-Up: At [facility], you and your health needs are our priority.  As part of our continuing mission to provide you with exceptional heart care, we have created designated Provider Care Teams.  These Care Teams include your primary Cardiologist (physician) and Advanced Practice Providers (APPs -  Physician Assistants and Nurse Practitioners) who all work together to provide you with the care you need, when you need it.  We recommend signing up for the patient portal called "MyChart".  Sign up information is provided on this After Visit Summary.  MyChart is used to connect with patients for Virtual Visits (Telemedicine).  Patients are able to view lab/test results, encounter notes, upcoming appointments, etc.  Non-urgent messages can be sent to your provider as well.   To learn more about what you can do with MyChart, go to [email or web address].    Your next appointment:   6 month(s)  Provider:   You may see Bryan Lemma, MD

## 2023-07-20 DIAGNOSIS — C4442 Squamous cell carcinoma of skin of scalp and neck: Secondary | ICD-10-CM | POA: Diagnosis not present

## 2023-07-20 DIAGNOSIS — D485 Neoplasm of uncertain behavior of skin: Secondary | ICD-10-CM | POA: Diagnosis not present

## 2023-08-02 DIAGNOSIS — L988 Other specified disorders of the skin and subcutaneous tissue: Secondary | ICD-10-CM | POA: Diagnosis not present

## 2023-08-02 DIAGNOSIS — L814 Other melanin hyperpigmentation: Secondary | ICD-10-CM | POA: Diagnosis not present

## 2023-08-02 DIAGNOSIS — L578 Other skin changes due to chronic exposure to nonionizing radiation: Secondary | ICD-10-CM | POA: Diagnosis not present

## 2023-08-02 DIAGNOSIS — C4442 Squamous cell carcinoma of skin of scalp and neck: Secondary | ICD-10-CM | POA: Diagnosis not present

## 2023-08-24 ENCOUNTER — Ambulatory Visit (INDEPENDENT_AMBULATORY_CARE_PROVIDER_SITE_OTHER): Payer: Medicare HMO

## 2023-08-24 DIAGNOSIS — Z0000 Encounter for general adult medical examination without abnormal findings: Secondary | ICD-10-CM | POA: Diagnosis not present

## 2023-08-24 NOTE — Patient Instructions (Addendum)
 [name] , Thank you for taking time to come for your Medicare Wellness Visit. I appreciate your ongoing commitment to your health goals. Please review the following plan we discussed and let me know if I can assist you in the future.   Referrals/Orders/Follow-Ups/Clinician Recommendations: NONE  This is a list of the screening recommended for you and due dates:  Health Maintenance  Topic Date Due   Zoster (Shingles) Vaccine (1 of 2) Never done   COVID-19 Vaccine (3 - Moderna risk series) 01/24/2020   Pneumonia Vaccine (3 of 3 - PPSV23 or PCV20) 08/06/2021   Flu Shot  11/14/2023*   Screening for Lung Cancer  10/14/2023   DTaP/Tdap/Td vaccine (2 - Td or Tdap) 01/29/2024   Medicare Annual Wellness Visit  08/23/2024   Colon Cancer Screening  01/28/2028   Hepatitis C Screening  Completed   HPV Vaccine  Aged Out  *Topic was postponed. The date shown is not the original due date.    Advanced directives: (ACP Link)Information on Advanced Care Planning can be found at [place]  Secretary of [facility] Advance Health Care Directives Advance Health Care Directives ([email or web address]   Next Medicare Annual Wellness Visit scheduled for next year: Yes    08/29/24 @ 2:40 PM BY VIDEO

## 2023-08-24 NOTE — Progress Notes (Signed)
 Subjective:   Tyler Estes is a 68 y.o. male who presents for Medicare Annual/Subsequent preventive examination.  Visit Complete: Virtual I connected with  Tyler Estes on 08/24/23 by a video and audio enabled telemedicine application and verified that I am speaking with the correct person using two identifiers.  Patient Location: Home  Provider Location: Office/Clinic  I discussed the limitations of evaluation and management by telemedicine. The patient expressed understanding and agreed to proceed.  Vital Signs: Because this visit was a virtual/telehealth visit, some criteria may be missing or patient reported. Any vitals not documented were not able to be obtained and vitals that have been documented are patient reported.  Cardiac Risk Factors include: advanced age (>32men, >36 women);dyslipidemia;male gender;hypertension     Objective:    There were no vitals filed for this visit. There is no height or weight on file to calculate BMI.     08/24/2023    3:23 PM 08/20/2022    3:52 PM 08/12/2021    3:39 PM 01/27/2018    7:48 AM 03/02/2016    1:53 PM 12/31/2015    4:00 PM 09/30/2015    3:08 PM  Advanced Directives  Does Patient Have a Medical Advance Directive? No No No No No No No  Would patient like information on creating a medical advance directive? No - Patient declined No - Patient declined Yes (MAU/Ambulatory/Procedural Areas - Information given)        Current Medications (verified) Outpatient Encounter Medications as of 08/24/2023  Medication Sig   lisinopril -hydrochlorothiazide  (ZESTORETIC ) 20-25 MG tablet Take 2 tablets by mouth daily.   rosuvastatin  (CRESTOR ) 20 MG tablet Take 1 tablet (20 mg total) by mouth daily.   warfarin (COUMADIN ) 7.5 MG tablet TAKE 1/2 TABLET BY MOUTH ON MONDAYS. THEN TAKE 1 TABLET DAILY THE REST OF THE WEEK.   No facility-administered encounter medications on file as of 08/24/2023.    Allergies (verified) Patient has no known allergies.    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 per patient's choice   Hyperlipidemia LDL goal <100    Hypertension    Past Surgical History:  Procedure Laterality Date   COLONOSCOPY     COLONOSCOPY WITH PROPOFOL  N/A 01/27/2018   Procedure: COLONOSCOPY WITH PROPOFOL ;  Surgeon: Gaylyn Gladis PENNER, MD;  Location: [facility] ENDOSCOPY;  Service: Endoscopy;  Laterality: N/A;   VASCULAR SURGERY  2013   Leg   VASECTOMY     Family History  Problem Relation Age of Onset   Hyperlipidemia Mother    Heart disease Father    Mesothelioma Father    Dementia Sister    Social History   Socioeconomic History   Marital status: Married    Spouse name: Tyler Estes   Number of children: 2   Years of education: HS   Highest education level: Not on file  Occupational History   Occupation: Merchandiser, Retail at [employer]    Comment: [employer]   Occupation: Retired  Tobacco Use   Smoking status: Every Day    Current packs/day: 1.00    Average packs/day: 1 pack/day for 47.0 years (47.0 ttl pk-yrs)    Types: Cigarettes   Smokeless tobacco: Never   Tobacco comments:  currently .75ppd  Vaping Use   Vaping status: Never Used  Substance and Sexual Activity   Alcohol use: Yes    Alcohol/week: 28.0 standard drinks of alcohol    Types: 28 Cans of beer per week    Comment: 4 beers a day   Drug use: No    Comment: Quit in 1997   Sexual activity: Yes  Other Topics Concern   Not on file  Social History Narrative   Not on file   Social Drivers of Health   Financial Resource Strain: Low Risk  (08/24/2023)   Overall Financial Resource Strain (CARDIA)    Difficulty of Paying Living Expenses: Not hard at all  Food Insecurity: No Food  Insecurity (08/24/2023)   Hunger Vital Sign    Worried About Running Out of Food in the Last Year: Never true    Ran Out of Food in the Last Year: Never true  Transportation Needs: No Transportation Needs (08/24/2023)   PRAPARE - Administrator, Civil Service (Medical): No    Lack of Transportation (Non-Medical): No  Physical Activity: Sufficiently Active (08/24/2023)   Exercise Vital Sign    Days of Exercise per Week: 3 days    Minutes of Exercise per Session: 60 min  Stress: No Stress Concern Present (08/24/2023)   [employer] of Occupational Health - Occupational Stress Questionnaire    Feeling of Stress : Not at all  Social Connections: Moderately Isolated (08/24/2023)   Social Connection and Isolation Panel [NHANES]    Frequency of Communication with Friends and Family: Twice a week    Frequency of Social Gatherings with Friends and Family: Never    Attends Religious Services: Never    Database Administrator or Organizations: Yes    Attends Engineer, Structural: More than 4 times per year    Marital Status: Married    Tobacco Counseling Ready to quit: Not Answered Counseling given: Not Answered Tobacco comments: currently .75ppd   Clinical Intake:  Pre-visit preparation completed: Yes  Pain : No/denies pain     BMI - recorded: 28.2 Nutritional Status: BMI 25 -29 Overweight Nutritional Risks: None Diabetes: No  How often do you need to have someone help you when you read instructions, pamphlets, or other written materials from your doctor or pharmacy?: 1 - Never  Interpreter Needed?: No  Information entered by :: Tyler DAS, LPN   Activities of Daily Living    08/24/2023    3:25 PM  In your present state of health, do you have any difficulty performing the following activities:  Hearing? 0  Vision? 0  Difficulty concentrating or making decisions? 0  Walking or climbing stairs? 0  Dressing or bathing? 0  Doing errands, shopping? 0   Preparing Food and eating ? N  Using the Toilet? N  In the past six months, have you accidently leaked urine? N  Do you have problems with loss of bowel control? N  Managing your Medications? N  Managing your Finances? N  Housekeeping or managing your Housekeeping? N    Patient Care Team: Justus Leita DEL, MD as PCP - General (Family Medicine) Anner Alm ORN, MD as PCP - Cardiology (Cardiology) Ora Deward GRADE, MD (Inactive) (Gastroenterology)  Indicate any recent Medical Services you may have received from other than [facility] providers in the past year (date may be approximate).     Assessment:   This is a routine wellness examination for Tyler Estes.  Hearing/Vision screen Hearing Screening -  Comments:: NO AIDS Vision Screening - Comments:: WEARS GLASSES ALL THE TIME- DR[facility] IN [facility]   Goals Addressed             This Visit's Progress    DIET - EAT MORE FRUITS AND VEGETABLES         Depression Screen    08/24/2023    3:20 PM 06/07/2023    9:35 AM 12/07/2022    9:15 AM 08/20/2022    3:28 PM 06/08/2022    8:37 AM 12/03/2021    8:37 AM 08/12/2021    3:36 PM  PHQ 2/9 Scores  PHQ - 2 Score 0 0 0 0 0 0 0  PHQ- 9 Score 0 0 1  0 1     Fall Risk    08/24/2023    3:25 PM 06/07/2023    9:35 AM 12/07/2022    9:15 AM 08/20/2022    3:28 PM 06/08/2022    8:38 AM  Fall Risk   Falls in the past year? 0 0 0 0 0  Number falls in past yr: 0 0 0 0 0  Injury with Fall? 0 0 0 0 0  Risk for fall due to : No Fall Risks No Fall Risks No Fall Risks No Fall Risks No Fall Risks  Follow up Falls prevention discussed;Falls evaluation completed Falls evaluation completed Falls evaluation completed Falls evaluation completed Falls evaluation completed    MEDICARE RISK AT HOME: Medicare Risk at Home Any stairs in or around the home?: No If so, are there any without handrails?: No Home free of loose throw rugs in walkways, pet beds, electrical cords, etc?: Yes Adequate lighting in your  home to reduce risk of falls?: Yes Life alert?: Yes Use of a cane, walker or w/c?: No Grab bars in the bathroom?: No Shower chair or bench in shower?: No Elevated toilet seat or a handicapped toilet?: No  TIMED UP AND GO:  Was the test performed?  No    Cognitive Function:        08/24/2023    3:26 PM 08/20/2022    3:33 PM  6CIT Screen  What Year? 0 points 0 points  What month? 0 points 0 points  What time? 0 points 0 points  Count back from 20 0 points 0 points  Months in reverse 0 points 0 points  Repeat phrase 0 points 0 points  Total Score 0 points 0 points    Immunizations Immunization History  Administered Date(s) Administered   Moderna Sars-Covid-2 Vaccination 11/30/2019, 12/27/2019   Pneumococcal Conjugate-13 08/06/2020   Pneumococcal Polysaccharide-23 03/02/2016   Tdap 01/28/2014    TDAP status: Up to date  Flu Vaccine status: Declined, Education has been provided regarding the importance of this vaccine but patient still declined. Advised may receive this vaccine at local pharmacy or Health Dept. Aware to provide a copy of the vaccination record if obtained from local pharmacy or Health Dept. Verbalized acceptance and understanding.  Pneumococcal vaccine status: Up to date  Covid-19 vaccine status: Completed vaccines  Qualifies for Shingles Vaccine? Yes   Zostavax completed No   Shingrix Completed?: No.    Education has been provided regarding the importance of this vaccine. Patient has been advised to call insurance company to determine out of pocket expense if they have not yet received this vaccine. Advised may also receive vaccine at local pharmacy or Health Dept. Verbalized acceptance and understanding.  Screening Tests Health Maintenance  Topic Date Due   Zoster Vaccines- Shingrix (  1 of 2) Never done   COVID-19 Vaccine (3 - Moderna risk series) 01/24/2020   Pneumonia Vaccine 3+ Years old (3 of 3 - PPSV23 or PCV20) 08/06/2021   INFLUENZA VACCINE   11/14/2023 (Originally 03/17/2023)   Lung Cancer Screening  10/14/2023   DTaP/Tdap/Td (2 - Td or Tdap) 01/29/2024   Medicare Annual Wellness (AWV)  08/23/2024   Colonoscopy  01/28/2028   Hepatitis C Screening  Completed   HPV VACCINES  Aged Out    Health Maintenance  Health Maintenance Due  Topic Date Due   Zoster Vaccines- Shingrix (1 of 2) Never done   COVID-19 Vaccine (3 - Moderna risk series) 01/24/2020   Pneumonia Vaccine 15+ Years old (3 of 3 - PPSV23 or PCV20) 08/06/2021    Colorectal cancer screening: Type of screening: Colonoscopy. Completed 01/27/18. Repeat every 10 years  Lung Cancer Screening: (Low Dose CT Chest recommended if Age 28-80 years, 20 pack-year currently smoking OR have quit w/in 15years.) does qualify.   Lung Cancer Screening Referral: CT SCAN DONE 10/14/22  Additional Screening:  Hepatitis C Screening: does qualify; Completed 10/12/17  Vision Screening: Recommended annual ophthalmology exams for early detection of glaucoma and other disorders of the eye. Is the patient up to date with their annual eye exam?  Yes  Who is the provider or what is the name of the office in which the patient attends annual eye exams? DR[facility]ROGERS [facility] If pt is not established with a provider, would they like to be referred to a provider to establish care? No .   Dental Screening: Recommended annual dental exams for proper oral hygiene   Community Resource Referral / Chronic Care Management: CRR required this visit?  No   CCM required this visit?  No     Plan:     I have personally reviewed and noted the following in the patient's chart:   Medical and social history Use of alcohol, tobacco or illicit drugs  Current medications and supplements including opioid prescriptions. Patient is not currently taking opioid prescriptions. Functional ability and status Nutritional status Physical activity Advanced directives List of other physicians Hospitalizations,  surgeries, and ER visits in previous 12 months Vitals Screenings to include cognitive, depression, and falls Referrals and appointments  In addition, I have reviewed and discussed with patient certain preventive protocols, quality metrics, and best practice recommendations. A written personalized care plan for preventive services as well as general preventive health recommendations were provided to patient.     Tyler GORMAN Das, LPN   03/16/7973   After Visit Summary: (MyChart) Due to this being a telephonic visit, the after visit summary with patients personalized plan was offered to patient via MyChart   Nurse Notes: NONE

## 2023-09-23 ENCOUNTER — Other Ambulatory Visit: Payer: Self-pay | Admitting: Internal Medicine

## 2023-09-23 DIAGNOSIS — Z7901 Long term (current) use of anticoagulants: Secondary | ICD-10-CM

## 2023-09-23 NOTE — Telephone Encounter (Signed)
 Medication Refill -  Most Recent Primary Care Visit:  Provider: GWENN JHONNIE RAMAN  Department: [facility]  Visit Type: MEDICARE AWV, SEQUENTIAL  Date: 08/24/2023  Medication: warfarin (COUMADIN ) 7.5 MG tablet [[id number]]   Has the patient contacted their pharmacy? Yes (Agent: If no, request that the patient contact the pharmacy for the refill. If patient does not wish to contact the pharmacy document the reason why and proceed with request.) (Agent: If yes, when and what did the pharmacy advise?)  Is this the correct pharmacy for this prescription? Yes If no, delete pharmacy and type the correct one.  This is the patient's preferred pharmacy:  [facility] [address] [place] - [address] [place] [address] Phone: [phone] Fax: [phone]     Has the prescription been filled recently? Yes  Is the patient out of the medication? NO 6 pills left  Has the patient been seen for an appointment in the last year OR does the patient have an upcoming appointment? Yes  Can we respond through MyChart? Yes  Agent: Please be advised that Rx refills may take up to 3 business days. We ask that you follow-up with your pharmacy.

## 2023-09-26 ENCOUNTER — Other Ambulatory Visit: Payer: Self-pay

## 2023-09-26 MED ORDER — WARFARIN SODIUM 7.5 MG PO TABS: ORAL_TABLET | ORAL | 0 refills | Status: DC

## 2023-09-26 NOTE — Telephone Encounter (Signed)
 Requested medication (s) are due for refill today - yes  Requested medication (s) are on the active medication list -yes  Future visit scheduled -yes  Last refill: 06/23/23 #84  Notes to clinic: Manual review required  Requested Prescriptions  Pending Prescriptions Disp Refills   warfarin (COUMADIN ) 7.5 MG tablet 84 tablet 0    Sig: TAKE 1/2 TABLET BY MOUTH ON MONDAYS. THEN TAKE 1 TABLET DAILY THE REST OF THE WEEK.     Hematology:  Anticoagulants - warfarin Failed - 09/26/2023 10:02 AM      Failed - Manual Review: If patient's warfarin is managed by Anti-Coag team, route request to them. If not, route request to the provider.      Failed - INR in normal range and within 30 days    INR  Date Value Ref Range Status  06/07/2023 3.1 (H) 0.9 - 1.2 Final    Comment:    Reference interval is for non-anticoagulated patients. Suggested INR therapeutic range for Vitamin K antagonist therapy:    Standard Dose (moderate intensity                   therapeutic range):       2.0 - 3.0    Higher intensity therapeutic range       2.5 - 3.5          Passed - HCT in normal range and within 360 days    Hematocrit  Date Value Ref Range Status  12/07/2022 48.1 37.5 - 51.0 % Final         Passed - Patient is not pregnant      Passed - Valid encounter within last 3 months    Recent Outpatient Visits           3 months ago Essential hypertension   [place] [facility] at [facility], Chales [place], MD   9 months ago Annual physical exam   [facility] & [facility] at [facility], Chales [place], MD   1 year ago Essential hypertension   [place] [facility] at [facility], Chales [place], MD   1 year ago Annual physical exam   [facility] at [facility], Chales [place], MD   2 years ago Essential hypertension   [place] [facility] at [facility], Chales [place], MD       Future Appointments             In 2 months Gala Jubilee [place], MD [facility] at [facility], [facility]   In 7 months Elta Halter, MD [facility]               Requested Prescriptions  Pending Prescriptions Disp Refills   warfarin (COUMADIN ) 7.5 MG tablet 84 tablet 0    Sig: TAKE 1/2 TABLET BY MOUTH ON MONDAYS. THEN TAKE 1 TABLET DAILY THE REST OF THE WEEK.     Hematology:  Anticoagulants - warfarin Failed - 09/26/2023 10:02 AM      Failed - Manual Review: If patient's warfarin is managed by Anti-Coag team, route request to them. If not, route request to the provider.      Failed - INR in normal range and within 30 days    INR  Date Value Ref Range Status  06/07/2023 3.1 (H) 0.9 - 1.2  Final    Comment:    Reference interval is for non-anticoagulated patients. Suggested INR therapeutic range for Vitamin K antagonist therapy:    Standard Dose (moderate intensity                   therapeutic range):       2.0 - 3.0    Higher intensity therapeutic range       2.5 - 3.5          Passed - HCT in normal range and within 360 days    Hematocrit  Date Value Ref Range Status  12/07/2022 48.1 37.5 - 51.0 % Final         Passed - Patient is not pregnant      Passed - Valid encounter within last 3 months    Recent Outpatient Visits           3 months ago Essential hypertension   [place] [facility] at [facility], Chales [place], MD   9 months ago Annual physical exam   [facility] at [facility], Chales [place], MD   1 year ago Essential hypertension   [place] [facility] at [facility], Chales [place], MD   1 year ago Annual physical exam   [facility] Primary Care & [facility] Medicine at [facility], Chales [place], MD   2 years ago Essential hypertension   [facility]  Primary Care & Sports Medicine at [facility], Chales [place], MD       Future Appointments             In 2 months Gala Jubilee, [place], MD [facility] Primary Care & Sports Medicine at [facility], [facility]   In 7 months Elta Halter, MD [facility]

## 2023-10-20 ENCOUNTER — Other Ambulatory Visit: Payer: Self-pay | Admitting: Acute Care

## 2023-10-20 DIAGNOSIS — Z87891 Personal history of nicotine dependence: Secondary | ICD-10-CM

## 2023-10-20 DIAGNOSIS — Z122 Encounter for screening for malignant neoplasm of respiratory organs: Secondary | ICD-10-CM

## 2023-10-20 DIAGNOSIS — F17210 Nicotine dependence, cigarettes, uncomplicated: Secondary | ICD-10-CM

## 2023-10-26 ENCOUNTER — Ambulatory Visit

## 2023-10-27 ENCOUNTER — Ambulatory Visit
Admission: RE | Admit: 2023-10-27 | Discharge: 2023-10-27 | Disposition: A | Discharge status: Home / Self Care | Source: Ambulatory Visit | Attending: Acute Care | Admitting: Acute Care

## 2023-10-27 DIAGNOSIS — F17210 Nicotine dependence, cigarettes, uncomplicated: Secondary | ICD-10-CM | POA: Insufficient documentation

## 2023-10-27 DIAGNOSIS — Z87891 Personal history of nicotine dependence: Secondary | ICD-10-CM | POA: Insufficient documentation

## 2023-10-27 DIAGNOSIS — Z122 Encounter for screening for malignant neoplasm of respiratory organs: Secondary | ICD-10-CM | POA: Diagnosis present

## 2023-11-23 ENCOUNTER — Other Ambulatory Visit: Payer: Self-pay

## 2023-11-23 DIAGNOSIS — F17210 Nicotine dependence, cigarettes, uncomplicated: Secondary | ICD-10-CM

## 2023-11-23 DIAGNOSIS — Z122 Encounter for screening for malignant neoplasm of respiratory organs: Secondary | ICD-10-CM

## 2023-11-23 DIAGNOSIS — Z87891 Personal history of nicotine dependence: Secondary | ICD-10-CM

## 2023-11-27 NOTE — Progress Notes (Unsigned)
 Cardiology Clinic Note   Date: 12/01/2023 ID: Tyler Estes, DOB 1955-12-23, MRN [id number]  Primary Cardiologist:  Randene Bustard, MD  Chief Complaint   Tyler Estes is a 68 y.o. male who presents to the clinic today for routine follow up.   Patient Profile   Tyler Estes is followed by Dr. Addie Holstein for the history outlined below.      Past medical history significant for: Coronary artery calcification. Seen on CT. Ascending aortic dilatation. Echo 02/24/2023: EF 60 to 65%.  No RWMA.  Grade I DD.  Normal RV size/function.  Mild MR/AI.  Mild dilatation of ascending aorta 40 mm. Pulmonary hypertension. Hypertension. Hyperlipidemia. Lipid panel 12/07/2022: LDL 91, HDL 62, TG 184, total 184. Emphysema. 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 Tobacco abuse.  In summary, February 2022 revealed ascending thoracic aorta 3.8 cm, moderate coronary calcification left main and LAD.  CT chest (lung cancer screening) February 2024 showed benign appearance of the lung, aortic atherosclerosis with coronary calcification, enlarged pulmonary trunk indicating possible pulmonary arterial hypertension, emphysema.  Patient also with history of PE on Coumadin.  Was complicated by left lower extremity engorged varicose vein for which he underwent GSV ablation.  Patient was first evaluated by Dr. Addie Holstein on 02/10/2023 for pulmonary hypertension at the request of Dr. Gala Jubilee.  No complaints at the time of his visit.  He reported family history of heart disease in father who had an MI at age 64 and died with mesothelioma in his late 54s.  He reported dyspnea with heavier exertion or excessive heat.  Echo was ordered as detailed above.   Patient was last seen in the office by me on 06/30/2023 for routine follow-up.  He was doing well at that time and no changes were made.     History of Present Illness    Today, patient is doing well. Patient denies shortness of breath, dyspnea on exertion,  lower extremity edema, orthopnea or PND. No chest pain, pressure, or tightness. No palpitations.  He remains active playing golf 3 times a week and performing yard work. He continues to smoke a pack a day. He has a visit with PCP next week and will have routine labs at that time.     ROS: All other systems reviewed and are otherwise negative except as noted in History of Present Illness.  EKGs/Labs Reviewed    EKG Interpretation Date/Time:  Thursday December 01 2023 10:00:44 EDT Ventricular Rate:  73 PR Interval:  198 QRS Duration:  66 QT Interval:  374 QTC Calculation: 412 R Axis:   75  Text Interpretation: Normal sinus rhythm T wave abnormality, consider lateral ischemia When compared with ECG of 30-Jun-2023 09:59, No significant change Confirmed by Morey Ar [phone]) on 12/01/2023 10:07:20 AM   12/07/2022: ALT 29; AST 22 06/07/2023: BUN 15; Creatinine, Ser 1.08; Potassium 3.8; Sodium 140   12/07/2022: Hemoglobin 17.3; WBC 4.4    Physical Exam    VS:  BP 136/80 (BP Location: Left Arm, Patient Position: Sitting, Cuff Size: Normal)   Pulse 73   Ht 6' (1.829 m)   Wt 205 lb (93 kg)   SpO2 96%   BMI 27.80 kg/m  , BMI Body mass index is 27.8 kg/m.  GEN: Well nourished, well developed, in no acute distress. Neck: No JVD or carotid bruits. Cardiac:  RRR. No murmurs. No rubs or gallops.   Respiratory:  Respirations regular and unlabored. Diminished breath sounds bilateral bases without rales, wheezing or rhonchi. GI: Soft, nontender,  nondistended. Extremities: Radials/DP/PT 2+ and equal bilaterally. No clubbing or cyanosis. No edema   Skin: Warm and dry, no rash. Neuro: Strength intact.  Assessment & Plan   Coronary artery calcification Seen on CT.  Patient denies chest pain, pressure, tightness. Active golfing 3 times a week and doing yard work. -No further workup indicated at this time.   Ascending aortic dilatation Echo July 2024 showed mild dilatation of ascending  aorta 40 mm.  Patient denies chest pain, back pain, shortness of breath, DOE, lightheadedness, dizziness. Patient undergoes yearly CT chest for cancer screening.  -Continue to monitor.   Hypertension BP today 140/80 on intake and 136/80 on my recheck. Patient does not normally check BP at home. He and his wife recently received a BP cuff. He is encouraged to check BP a few times a week.  -Continue Zestoretic.   Hyperlipidemia LDL April 2024 91, at goal. -Continue rosuvastatin. -Followed by PCP.   History of PE/DVT Patient denies spontaneous bleeding concerns.  -Continue Coumadin managed by PCP.   Tobacco Abuse Smokes 1 pack of cigarettes a day. He would like to quit. Discussed systematically cutting back until complete cessation. He agrees to try.  -Encouraged cessation.   Disposition: Return in 6 months or sooner as needed.          Signed, Lonell Rives. Ralpheal Zappone, DNP, NP-C

## 2023-12-01 ENCOUNTER — Ambulatory Visit: Attending: Student | Admitting: Student

## 2023-12-01 ENCOUNTER — Encounter: Payer: Self-pay | Admitting: Student

## 2023-12-01 VITALS — BP 136/80 | HR 73 | Ht 72.0 in | Wt 205.0 lb

## 2023-12-01 DIAGNOSIS — E785 Hyperlipidemia, unspecified: Secondary | ICD-10-CM

## 2023-12-01 DIAGNOSIS — Z86718 Personal history of other venous thrombosis and embolism: Secondary | ICD-10-CM

## 2023-12-01 DIAGNOSIS — I2510 Atherosclerotic heart disease of native coronary artery without angina pectoris: Secondary | ICD-10-CM

## 2023-12-01 DIAGNOSIS — Z86711 Personal history of pulmonary embolism: Secondary | ICD-10-CM

## 2023-12-01 DIAGNOSIS — Z720 Tobacco use: Secondary | ICD-10-CM

## 2023-12-01 DIAGNOSIS — I10 Essential (primary) hypertension: Secondary | ICD-10-CM | POA: Diagnosis not present

## 2023-12-01 DIAGNOSIS — I77810 Thoracic aortic ectasia: Secondary | ICD-10-CM

## 2023-12-01 NOTE — Patient Instructions (Signed)
 Medication Instructions:  Your physician recommends that you continue on your current medications as directed. Please refer to the Current Medication list given to you today.  *If you need a refill on your cardiac medications before your next appointment, please call your pharmacy*  Follow-Up: At [facility], you and your health needs are our priority.  As part of our continuing mission to provide you with exceptional heart care, our providers are all part of one team.  This team includes your primary Cardiologist (physician) and Advanced Practice Providers or APPs (Physician Assistants and Nurse Practitioners) who all work together to provide you with the care you need, when you need it.  Your next appointment:   6 month(s)  Provider:   Randene Bustard, MD or Morey Ar, NP    We recommend signing up for the patient portal called "MyChart".  Sign up information is provided on this After Visit Summary.  MyChart is used to connect with patients for Virtual Visits (Telemedicine).  Patients are able to view lab/test results, encounter notes, upcoming appointments, etc.  Non-urgent messages can be sent to your provider as well.   To learn more about what you can do with MyChart, go to [email or web address].

## 2023-12-03 ENCOUNTER — Other Ambulatory Visit: Payer: Self-pay | Admitting: Internal Medicine

## 2023-12-03 DIAGNOSIS — E7800 Pure hypercholesterolemia, unspecified: Secondary | ICD-10-CM

## 2023-12-05 NOTE — Telephone Encounter (Signed)
 Requested Prescriptions  Pending Prescriptions Disp Refills   rosuvastatin  (CRESTOR ) 20 MG tablet [Pharmacy Med Name: Rosuvastatin  Calcium  20 MG Oral Tablet] 90 tablet 0    Sig: Take 1 tablet by mouth once daily     Cardiovascular:  Antilipid - Statins 2 Failed - 12/05/2023 10:26 AM      Failed - Valid encounter within last 12 months    Recent Outpatient Visits   None     Future Appointments             Tomorrow Sheron Dixons, MD [facility] Primary Care & Sports Medicine at [facility], [facility]   In 4 months Elta Halter, MD [facility]            Failed - Lipid Panel in normal range within the last 12 months    Cholesterol, Total  Date Value Ref Range Status  12/07/2022 184 100 - 199 mg/dL Final   LDL Chol Calc (NIH)  Date Value Ref Range Status  12/07/2022 91 0 - 99 mg/dL Final   HDL  Date Value Ref Range Status  12/07/2022 62 >39 mg/dL Final   Triglycerides  Date Value Ref Range Status  12/07/2022 184 (H) 0 - 149 mg/dL Final         Passed - Cr in normal range and within 360 days    Creatinine, Ser  Date Value Ref Range Status  06/07/2023 1.08 0.76 - 1.27 mg/dL Final         Passed - Patient is not pregnant

## 2023-12-06 ENCOUNTER — Encounter: Payer: Self-pay | Admitting: Internal Medicine

## 2023-12-06 ENCOUNTER — Ambulatory Visit (INDEPENDENT_AMBULATORY_CARE_PROVIDER_SITE_OTHER): Payer: Self-pay | Admitting: Internal Medicine

## 2023-12-06 VITALS — BP 116/70 | HR 74 | Ht 72.0 in | Wt 205.0 lb

## 2023-12-06 DIAGNOSIS — Z0000 Encounter for general adult medical examination without abnormal findings: Secondary | ICD-10-CM

## 2023-12-06 DIAGNOSIS — I10 Essential (primary) hypertension: Secondary | ICD-10-CM

## 2023-12-06 DIAGNOSIS — Z7901 Long term (current) use of anticoagulants: Secondary | ICD-10-CM

## 2023-12-06 DIAGNOSIS — E785 Hyperlipidemia, unspecified: Secondary | ICD-10-CM | POA: Diagnosis not present

## 2023-12-06 DIAGNOSIS — R7303 Prediabetes: Secondary | ICD-10-CM

## 2023-12-06 DIAGNOSIS — C4492 Squamous cell carcinoma of skin, unspecified: Secondary | ICD-10-CM

## 2023-12-06 DIAGNOSIS — Z23 Encounter for immunization: Secondary | ICD-10-CM

## 2023-12-06 DIAGNOSIS — Z125 Encounter for screening for malignant neoplasm of prostate: Secondary | ICD-10-CM | POA: Diagnosis not present

## 2023-12-06 DIAGNOSIS — J432 Centrilobular emphysema: Secondary | ICD-10-CM

## 2023-12-06 DIAGNOSIS — I272 Pulmonary hypertension, unspecified: Secondary | ICD-10-CM

## 2023-12-06 MED ORDER — LISINOPRIL-HYDROCHLOROTHIAZIDE 20-25 MG PO TABS: 2.0000 | ORAL_TABLET | Freq: Every day | ORAL | 3 refills | Status: AC

## 2023-12-06 MED ORDER — WARFARIN SODIUM 7.5 MG PO TABS: ORAL_TABLET | ORAL | 3 refills | Status: AC

## 2023-12-06 NOTE — Assessment & Plan Note (Signed)
 LDL is  Lab Results  Component Value Date   LDLCALC 91 12/07/2022   Current regimen is crestor .  No medication side effects noted. Goal LDL is <70.

## 2023-12-06 NOTE — Assessment & Plan Note (Signed)
 Stable on CT without new symptoms  Remains active without limitations He does continue to smoke but is trying to cut back to quit completely

## 2023-12-06 NOTE — Progress Notes (Signed)
 Date:  12/06/2023   Name:  Tyler Estes   DOB:  [birth year]   MRN:  [id number]   Chief Complaint: Annual Exam Tyler Estes is a 68 y.o. male who presents today for his Complete Annual Exam. He feels well. He reports exercising plays golf 3 times a week. He reports he is sleeping well. He was seen by Cardiology yesterday and given an "A".  His wife is recovering from an MI and Stent placement 2 weeks ago at [facility].  Health Maintenance  Topic Date Due   COVID-19 Vaccine (3 - Moderna risk series) 12/22/2023*   Zoster (Shingles) Vaccine (1 of 2) 03/06/2024*   DTaP/Tdap/Td vaccine (2 - Td or Tdap) 01/29/2024   Flu Shot  03/16/2024   Medicare Annual Wellness Visit  08/23/2024   Screening for Lung Cancer  10/26/2024   Pneumonia Vaccine (3 of 3 - PCV20 or PCV21) 08/06/2025   Colon Cancer Screening  01/28/2028   Hepatitis C Screening  Completed   HPV Vaccine  Aged Out   Meningitis B Vaccine  Aged Out  *Topic was postponed. The date shown is not the original due date.    Lab Results  Component Value Date   PSA1 0.7 12/07/2022   PSA1 0.8 12/03/2021   PSA1 0.6 12/11/2020     Hypertension This is a chronic problem. The problem is controlled. Pertinent negatives include no chest pain, headaches, palpitations or shortness of breath. Past treatments include ACE inhibitors and diuretics. The current treatment provides significant improvement.  Hyperlipidemia This is a chronic problem. The problem is controlled. Pertinent negatives include no chest pain or shortness of breath. Current antihyperlipidemic treatment includes statins. The current treatment provides significant improvement of lipids.    Review of Systems  Constitutional:  Negative for fatigue and unexpected weight change.  HENT:  Negative for nosebleeds.   Eyes:  Negative for visual disturbance.  Respiratory:  Negative for cough, chest tightness, shortness of breath and wheezing.   Cardiovascular:  Negative for chest pain,  palpitations and leg swelling.  Gastrointestinal:  Negative for abdominal pain, constipation and diarrhea.  Neurological:  Negative for dizziness, weakness, light-headedness and headaches.     Lab Results  Component Value Date   NA 140 06/07/2023   K 3.8 06/07/2023   CO2 21 06/07/2023   GLUCOSE 98 06/07/2023   BUN 15 06/07/2023   CREATININE 1.08 06/07/2023   CALCIUM  9.4 06/07/2023   EGFR 75 06/07/2023   GFRNONAA 71 10/17/2019   Lab Results  Component Value Date   CHOL 184 12/07/2022   HDL 62 12/07/2022   LDLCALC 91 12/07/2022   TRIG 184 (H) 12/07/2022   CHOLHDL 3.0 12/07/2022   Lab Results  Component Value Date   TSH 1.40 09/25/2013   Lab Results  Component Value Date   HGBA1C 5.7 (H) 12/07/2022   Lab Results  Component Value Date   WBC 4.4 12/07/2022   HGB 17.3 12/07/2022   HCT 48.1 12/07/2022   MCV 97 12/07/2022   PLT 158 12/07/2022   Lab Results  Component Value Date   ALT 29 12/07/2022   AST 22 12/07/2022   ALKPHOS 56 12/07/2022   BILITOT 0.5 12/07/2022   No results found for: "25OHVITD2", "25OHVITD3", "VD25OH"   Patient Active Problem List   Diagnosis Date Noted   SCCA (squamous cell carcinoma) of skin 12/06/2023   Neoplasm of uncertain behavior of skin 06/07/2023   Coronary artery calcification seen on CAT scan 02/10/2023   Pulmonary hypertension,  unspecified (HCC) 02/10/2023   Prediabetes 02/26/2021   Spermatocele 12/11/2020   Centrilobular emphysema (HCC) 09/18/2020   Ascending aorta dilatation (HCC) 09/18/2020   Acquired thrombophilia (HCC) 08/06/2020   Aortic atherosclerosis (HCC) 10/12/2018   Anticoagulated on warfarin 10/12/2018   Benign colon polyp 01/10/2015   Hyperlipidemia LDL goal <100 01/10/2015   Essential hypertension 01/10/2015   History of pulmonary embolus (PE) 01/10/2015   Asymptomatic superficial varicose vein of left lower extremity 01/10/2015   Tobacco use disorder 01/10/2015    No Known Allergies  Past Surgical  History:  Procedure Laterality Date   COLONOSCOPY     COLONOSCOPY WITH PROPOFOL  N/A 01/27/2018   Procedure: COLONOSCOPY WITH PROPOFOL ;  Surgeon: Deveron Fly, MD;  Location: [facility] ENDOSCOPY;  Service: Endoscopy;  Laterality: N/A;   VASCULAR SURGERY  2013   Leg   VASECTOMY      Social History   Tobacco Use   Smoking status: Every Day    Current packs/day: 0.75    Average packs/day: 1 pack/day for 51.3 years (50.2 ttl pk-yrs)    Types: Cigarettes    Start date: 64   Smokeless tobacco: Never   Tobacco comments:    currently .75ppd  Vaping Use   Vaping status: Never Used  Substance Use Topics   Alcohol use: Yes    Alcohol/week: 28.0 standard drinks of alcohol    Types: 28 Cans of beer per week    Comment: 4 beers a day   Drug use: No    Comment: Quit in 1997     Medication list has been reviewed and updated.  Current Meds  Medication Sig   rosuvastatin  (CRESTOR ) 20 MG tablet Take 1 tablet by mouth once daily   [DISCONTINUED] lisinopril -hydrochlorothiazide  (ZESTORETIC ) 20-25 MG tablet Take 2 tablets by mouth daily.   [DISCONTINUED] warfarin (COUMADIN ) 7.5 MG tablet TAKE 1/2 TABLET BY MOUTH ON MONDAYS. THEN TAKE 1 TABLET DAILY THE REST OF THE WEEK.       12/06/2023    9:48 AM 06/07/2023    9:35 AM 12/07/2022    9:15 AM 06/08/2022    8:38 AM  GAD 7 : Generalized Anxiety Score  Nervous, Anxious, on Edge 0 0 0 0  Control/stop worrying 0 0 0 0  Worry too much - different things 1 0 0 0  Trouble relaxing 0 0 0 0  Restless 0 0 0 0  Easily annoyed or irritable 0 0 0 0  Afraid - awful might happen 0 0 0 0  Total GAD 7 Score 1 0 0 0  Anxiety Difficulty Not difficult at all Not difficult at all Not difficult at all Not difficult at all       12/06/2023    9:48 AM 08/24/2023    3:20 PM 06/07/2023    9:35 AM  Depression screen PHQ 2/9  Decreased Interest 1 0 0  Down, Depressed, Hopeless 0 0 0  PHQ - 2 Score 1 0 0  Altered sleeping 0 0 0  Tired, decreased energy 1  0 0  Change in appetite 0 0 0  Feeling bad or failure about yourself  0 0 0  Trouble concentrating 0 0 0  Moving slowly or fidgety/restless 0 0 0  Suicidal thoughts 0 0 0  PHQ-9 Score 2 0 0  Difficult doing work/chores Not difficult at all Not difficult at all Not difficult at all    BP Readings from Last 3 Encounters:  12/06/23 116/70  12/01/23 136/80  06/30/23 130/84  Physical Exam Vitals and nursing note reviewed.  Constitutional:      Appearance: Normal appearance. He is well-developed.  HENT:     Head: Normocephalic.     Right Ear: Tympanic membrane, ear canal and external ear normal.     Left Ear: Tympanic membrane, ear canal and external ear normal.     Nose: Nose normal.  Eyes:     Conjunctiva/sclera: Conjunctivae normal.     Pupils: Pupils are equal, round, and reactive to light.  Neck:     Thyroid: No thyromegaly.     Vascular: No carotid bruit.  Cardiovascular:     Rate and Rhythm: Normal rate and regular rhythm.     Pulses: Normal pulses.     Heart sounds: Normal heart sounds.  Pulmonary:     Effort: Pulmonary effort is normal.     Breath sounds: Normal breath sounds. No wheezing.  Chest:  Breasts:    Right: No mass.     Left: No mass.  Abdominal:     General: Bowel sounds are normal.     Palpations: Abdomen is soft.     Tenderness: There is no abdominal tenderness.  Musculoskeletal:        General: Normal range of motion.     Cervical back: Normal range of motion and neck supple.     Right lower leg: No edema.     Left lower leg: No edema.  Lymphadenopathy:     Cervical: No cervical adenopathy.  Skin:    General: Skin is warm and dry.     Capillary Refill: Capillary refill takes less than 2 seconds.     Comments: Healed SCCA site on right neck Eschar on left forearm - no s/s of infection  Neurological:     Mental Status: He is alert and oriented to person, place, and time.     Deep Tendon Reflexes: Reflexes are normal and symmetric.   Psychiatric:        Attention and Perception: Attention normal.        Mood and Affect: Mood normal.        Thought Content: Thought content normal.     Wt Readings from Last 3 Encounters:  12/06/23 205 lb (93 kg)  12/01/23 205 lb (93 kg)  06/30/23 208 lb 6.4 oz (94.5 kg)    BP 116/70   Pulse 74   Ht 6' (1.829 m)   Wt 205 lb (93 kg)   SpO2 95%   BMI 27.80 kg/m   Assessment and Plan:  Problem List Items Addressed This Visit       Unprioritized   Hyperlipidemia LDL goal <100   LDL is  Lab Results  Component Value Date   LDLCALC 91 12/07/2022   Current regimen is crestor .  No medication side effects noted. Goal LDL is <70.       Relevant Medications   warfarin (COUMADIN ) 7.5 MG tablet   lisinopril -hydrochlorothiazide  (ZESTORETIC ) 20-25 MG tablet   Other Relevant Orders   Lipid panel   Essential hypertension (Chronic)   Blood pressure is well controlled.  Current medications are lisinopril  and hydrochlorothiazide . Will continue same regimen along with efforts to limit dietary sodium.       Relevant Medications   warfarin (COUMADIN ) 7.5 MG tablet   lisinopril -hydrochlorothiazide  (ZESTORETIC ) 20-25 MG tablet   Other Relevant Orders   CBC with Differential/Platelet   Comprehensive metabolic panel with GFR   TSH   Urinalysis, Routine w reflex microscopic   Anticoagulated on  warfarin (Chronic)   No bleeding issues. Check INR      Relevant Medications   warfarin (COUMADIN ) 7.5 MG tablet   Other Relevant Orders   Protime-INR   Centrilobular emphysema (HCC)   Stable on CT without new symptoms  Remains active without limitations He does continue to smoke but is trying to cut back to quit completely      Prediabetes (Chronic)   Continue dietary changes with low carb choices. Lab Results  Component Value Date   HGBA1C 5.7 (H) 12/07/2022         Relevant Orders   Comprehensive metabolic panel with GFR   Hemoglobin A1c   Pulmonary hypertension,  unspecified (HCC)   Relevant Medications   warfarin (COUMADIN ) 7.5 MG tablet   lisinopril -hydrochlorothiazide  (ZESTORETIC ) 20-25 MG tablet   SCCA (squamous cell carcinoma) of skin   Recommend at least annual Dermatology surveillance.      Relevant Medications   warfarin (COUMADIN ) 7.5 MG tablet   Other Visit Diagnoses       Annual physical exam    -  Primary   continue healthy diet, low carb continue efforts to quit smoking LDCT scan stable     Prostate cancer screening       Relevant Orders   PSA     Immunization due       he continues to decline Prevnar and Shingrix       Return in about 6 months (around 06/06/2024) for HTN.    Sheron Dixons, MD [facility] Primary Care and Sports Medicine [place]

## 2023-12-06 NOTE — Assessment & Plan Note (Signed)
 Blood pressure is well controlled.  Current medications are lisinopril  and hydrochlorothiazide . Will continue same regimen along with efforts to limit dietary sodium.

## 2023-12-06 NOTE — Assessment & Plan Note (Signed)
 Recommend at least annual Dermatology surveillance.

## 2023-12-06 NOTE — Assessment & Plan Note (Signed)
 No bleeding issues. Check INR

## 2023-12-06 NOTE — Assessment & Plan Note (Signed)
 Continue dietary changes with low carb choices. Lab Results  Component Value Date   HGBA1C 5.7 (H) 12/07/2022

## 2023-12-07 ENCOUNTER — Encounter: Payer: Self-pay | Admitting: Internal Medicine

## 2023-12-07 ENCOUNTER — Other Ambulatory Visit: Payer: Self-pay | Admitting: Internal Medicine

## 2023-12-07 DIAGNOSIS — E7800 Pure hypercholesterolemia, unspecified: Secondary | ICD-10-CM

## 2023-12-07 LAB — PROTIME-INR
INR: 2.3 — ABNORMAL HIGH (ref 0.9–1.2)
Prothrombin Time: 24.1 s — ABNORMAL HIGH (ref 9.1–12.0)

## 2023-12-07 LAB — CBC WITH DIFFERENTIAL/PLATELET
Basophils Absolute: 0 10*3/uL (ref 0.0–0.2)
Basos: 1 %
EOS (ABSOLUTE): 0.1 10*3/uL (ref 0.0–0.4)
Eos: 2 %
Hematocrit: 48 % (ref 37.5–51.0)
Hemoglobin: 17 g/dL (ref 13.0–17.7)
Immature Grans (Abs): 0 10*3/uL (ref 0.0–0.1)
Immature Granulocytes: 0 %
Lymphocytes Absolute: 1 10*3/uL (ref 0.7–3.1)
Lymphs: 30 %
MCH: 34.8 pg — ABNORMAL HIGH (ref 26.6–33.0)
MCHC: 35.4 g/dL (ref 31.5–35.7)
MCV: 98 fL — ABNORMAL HIGH (ref 79–97)
Monocytes Absolute: 0.4 10*3/uL (ref 0.1–0.9)
Monocytes: 11 %
Neutrophils Absolute: 2 10*3/uL (ref 1.4–7.0)
Neutrophils: 56 %
Platelets: 162 10*3/uL (ref 150–450)
RBC: 4.89 x10E6/uL (ref 4.14–5.80)
RDW: 12.8 % (ref 11.6–15.4)
WBC: 3.5 10*3/uL (ref 3.4–10.8)

## 2023-12-07 LAB — URINALYSIS, ROUTINE W REFLEX MICROSCOPIC
Bilirubin, UA: NEGATIVE
Glucose, UA: NEGATIVE
Ketones, UA: NEGATIVE
Leukocytes,UA: NEGATIVE
Nitrite, UA: NEGATIVE
Protein,UA: NEGATIVE
RBC, UA: NEGATIVE
Specific Gravity, UA: 1.015 (ref 1.005–1.030)
Urobilinogen, Ur: 0.2 mg/dL (ref 0.2–1.0)
pH, UA: 6 (ref 5.0–7.5)

## 2023-12-07 LAB — COMPREHENSIVE METABOLIC PANEL WITH GFR
ALT: 36 IU/L (ref 0–44)
AST: 27 IU/L (ref 0–40)
Albumin: 4.8 g/dL (ref 3.9–4.9)
Alkaline Phosphatase: 56 IU/L (ref 44–121)
BUN/Creatinine Ratio: 14 (ref 10–24)
BUN: 16 mg/dL (ref 8–27)
Bilirubin Total: 0.6 mg/dL (ref 0.0–1.2)
CO2: 20 mmol/L (ref 20–29)
Calcium: 9.5 mg/dL (ref 8.6–10.2)
Chloride: 103 mmol/L (ref 96–106)
Creatinine, Ser: 1.14 mg/dL (ref 0.76–1.27)
Globulin, Total: 2.1 g/dL (ref 1.5–4.5)
Glucose: 99 mg/dL (ref 70–99)
Potassium: 4.2 mmol/L (ref 3.5–5.2)
Sodium: 140 mmol/L (ref 134–144)
Total Protein: 6.9 g/dL (ref 6.0–8.5)
eGFR: 70 mL/min/{1.73_m2} (ref >59)

## 2023-12-07 LAB — PSA: Prostate Specific Ag, Serum: 0.8 ng/mL (ref 0.0–4.0)

## 2023-12-07 LAB — LIPID PANEL
Chol/HDL Ratio: 3.1 ratio (ref 0.0–5.0)
Cholesterol, Total: 183 mg/dL (ref 100–199)
HDL: 60 mg/dL (ref >39)
LDL Chol Calc (NIH): 96 mg/dL (ref 0–99)
Triglycerides: 154 mg/dL — ABNORMAL HIGH (ref 0–149)
VLDL Cholesterol Cal: 27 mg/dL (ref 5–40)

## 2023-12-07 LAB — HEMOGLOBIN A1C
Est. average glucose Bld gHb Est-mCnc: 120 mg/dL
Hgb A1c MFr Bld: 5.8 % — ABNORMAL HIGH (ref 4.8–5.6)

## 2023-12-07 LAB — TSH: TSH: 1.58 u[IU]/mL (ref 0.450–4.500)

## 2023-12-07 MED ORDER — ROSUVASTATIN CALCIUM 40 MG PO TABS: 40.0000 mg | ORAL_TABLET | Freq: Every day | ORAL | 1 refills | Status: DC

## 2023-12-29 ENCOUNTER — Telehealth: Payer: Self-pay | Admitting: Internal Medicine

## 2023-12-29 NOTE — Telephone Encounter (Signed)
Fyi . Please review

## 2023-12-29 NOTE — Telephone Encounter (Signed)
 Triage RN called pharmacy to confirm requested Rx was sent & received by [facility]. Rep confirmed and received with refills and will start process of refill for pt.

## 2023-12-29 NOTE — Telephone Encounter (Unsigned)
 Copied from CRM [id number]. Topic: Clinical - Medication Refill >> Dec 29, 2023 11:05 AM Jeris Montes S wrote: Medication: warfarin (COUMADIN ) 7.5 MG tablet  Has the patient contacted their pharmacy? Yes (Agent: If no, request that the patient contact the pharmacy for the refill. If patient does not wish to contact the pharmacy document the reason why and proceed with request.) (Agent: If yes, when and what did the pharmacy advise?)  This is the patient's preferred pharmacy:  [facility] [address] [place] - [address] [place] [address] Phone: [phone] Fax: [phone]  Is this the correct pharmacy for this prescription? Yes If no, delete pharmacy and type the correct one.   Has the prescription been filled recently? No  Is the patient out of the medication? Yes  Has the patient been seen for an appointment in the last year OR does the patient have an upcoming appointment? Yes  Can we respond through MyChart? Yes  Agent: Please be advised that Rx refills may take up to 3 business days. We ask that you follow-up with your pharmacy.

## 2024-03-28 ENCOUNTER — Telehealth: Payer: Self-pay

## 2024-03-28 NOTE — Telephone Encounter (Signed)
 Copied from CRM #[id number]. Topic: Appointments - Appointment Cancel/Reschedule >> Mar 28, 2024  3:01 PM Tyler Estes wrote: Patient/patient representative is calling to cancel or reschedule an appointment. Refer to attachments for appointment information.    Pt cancelled 09/11 appt with  skin care.

## 2024-03-29 NOTE — Telephone Encounter (Signed)
 Left VM for patient to call back.

## 2024-03-29 NOTE — Telephone Encounter (Signed)
 Copied from CRM #[id number]. Topic: Appointments - Appointment Cancel/Reschedule >> Mar 29, 2024 12:11 PM Emylou G wrote: Patient called.SABRA adv when to [facility] . Derm in [place] - said he had bump removed?  They said he is clear?  Pls review if appt is necessary

## 2024-04-26 ENCOUNTER — Ambulatory Visit: Payer: Medicare HMO | Admitting: Dermatology

## 2024-05-24 ENCOUNTER — Ambulatory Visit (INDEPENDENT_AMBULATORY_CARE_PROVIDER_SITE_OTHER): Admitting: Internal Medicine

## 2024-05-24 ENCOUNTER — Encounter: Payer: Self-pay | Admitting: Internal Medicine

## 2024-05-24 VITALS — BP 136/78 | HR 81 | Ht 72.0 in | Wt 206.0 lb

## 2024-05-24 DIAGNOSIS — Z23 Encounter for immunization: Secondary | ICD-10-CM | POA: Diagnosis not present

## 2024-05-24 DIAGNOSIS — E785 Hyperlipidemia, unspecified: Secondary | ICD-10-CM | POA: Diagnosis not present

## 2024-05-24 DIAGNOSIS — I10 Essential (primary) hypertension: Secondary | ICD-10-CM | POA: Diagnosis not present

## 2024-05-24 MED ORDER — ROSUVASTATIN CALCIUM 40 MG PO TABS: 40.0000 mg | ORAL_TABLET | Freq: Every day | ORAL | 1 refills | Status: AC

## 2024-05-24 NOTE — Progress Notes (Signed)
 Date:  05/24/2024   Name:  Tyler Estes   DOB:  1955-10-07   MRN:  [id number]   Chief Complaint: Hypertension  Hypertension This is a chronic problem. The problem is controlled. Pertinent negatives include no chest pain, headaches, palpitations or shortness of breath. Past treatments include diuretics and ACE inhibitors. The current treatment provides significant improvement.  Hyperlipidemia This is a chronic problem. The problem is controlled. Recent lipid tests were reviewed and are normal. Pertinent negatives include no chest pain or shortness of breath. Current antihyperlipidemic treatment includes statins. The current treatment provides significant improvement of lipids. There are no compliance problems.     Review of Systems  Constitutional:  Negative for fatigue and unexpected weight change.  HENT:  Negative for nosebleeds.   Eyes:  Negative for visual disturbance.  Respiratory:  Negative for cough, chest tightness, shortness of breath and wheezing.   Cardiovascular:  Negative for chest pain, palpitations and leg swelling.  Gastrointestinal:  Negative for abdominal pain, constipation and diarrhea.  Musculoskeletal:  Negative for arthralgias and joint swelling.  Neurological:  Negative for dizziness, weakness, light-headedness and headaches.  Psychiatric/Behavioral:  Negative for dysphoric mood and sleep disturbance. The patient is not nervous/anxious.      Lab Results  Component Value Date   NA 140 12/06/2023   K 4.2 12/06/2023   CO2 20 12/06/2023   GLUCOSE 99 12/06/2023   BUN 16 12/06/2023   CREATININE 1.14 12/06/2023   CALCIUM  9.5 12/06/2023   EGFR 70 12/06/2023   GFRNONAA 71 10/17/2019   Lab Results  Component Value Date   CHOL 183 12/06/2023   HDL 60 12/06/2023   LDLCALC 96 12/06/2023   TRIG 154 (H) 12/06/2023   CHOLHDL 3.1 12/06/2023   Lab Results  Component Value Date   TSH 1.580 12/06/2023   Lab Results  Component Value Date   HGBA1C 5.8 (H)  12/06/2023   Lab Results  Component Value Date   WBC 3.5 12/06/2023   HGB 17.0 12/06/2023   HCT 48.0 12/06/2023   MCV 98 (H) 12/06/2023   PLT 162 12/06/2023   Lab Results  Component Value Date   ALT 36 12/06/2023   AST 27 12/06/2023   ALKPHOS 56 12/06/2023   BILITOT 0.6 12/06/2023   No results found for: MARIEN BOLLS, VD25OH   Patient Active Problem List   Diagnosis Date Noted   SCCA (squamous cell carcinoma) of skin 12/06/2023   Neoplasm of uncertain behavior of skin 06/07/2023   Coronary artery calcification seen on CAT scan 02/10/2023   Pulmonary hypertension, unspecified (HCC) 02/10/2023   Prediabetes 02/26/2021   Spermatocele 12/11/2020   Centrilobular emphysema (HCC) 09/18/2020   Ascending aorta dilatation 09/18/2020   Acquired thrombophilia 08/06/2020   Aortic atherosclerosis 10/12/2018   Anticoagulated on warfarin 10/12/2018   Benign colon polyp 01/10/2015   Hyperlipidemia LDL goal <100 01/10/2015   Essential hypertension 01/10/2015   History of pulmonary embolus (PE) 01/10/2015   Asymptomatic superficial varicose vein of left lower extremity 01/10/2015   Tobacco use disorder 01/10/2015    No Known Allergies  Past Surgical History:  Procedure Laterality Date   COLONOSCOPY     COLONOSCOPY WITH PROPOFOL  N/A 01/27/2018   Procedure: COLONOSCOPY WITH PROPOFOL ;  Surgeon: Gaylyn Gladis PENNER, MD;  Location: [facility] END[facility]COPY;  Service: Endoscopy;  Laterality: N/A;   VASCULAR SURGERY  2013   Leg   VASECTOMY      Social History   Tobacco Use   Smoking status: Every Day  Current packs/day: 0.75    Average packs/day: 1 pack/day for 51.8 years (50.6 ttl pk-yrs)    Types: Cigarettes    Start date: 74   Smokeless tobacco: Never   Tobacco comments:    currently .75ppd  Vaping Use   Vaping status: Never Used  Substance Use Topics   Alcohol use: Yes    Alcohol/week: 28.0 standard drinks of alcohol    Types: 28 Cans of beer per week    Comment:  4 beers a day   Drug use: No    Comment: Quit in 1997     Medication list has been reviewed and updated.  Current Meds  Medication Sig   lisinopril -hydrochlorothiazide  (ZESTORETIC ) 20-25 MG tablet Take 2 tablets by mouth daily.   warfarin (COUMADIN ) 7.5 MG tablet TAKE 1/2 TABLET BY MOUTH ON MONDAYS. THEN TAKE 1 TABLET DAILY THE REST OF THE WEEK.   [DISCONTINUED] rosuvastatin  (CRESTOR ) 40 MG tablet Take 1 tablet (40 mg total) by mouth daily.       05/24/2024    9:43 AM 12/06/2023    9:48 AM 06/07/2023    9:35 AM 12/07/2022    9:15 AM  GAD 7 : Generalized Anxiety Score  Nervous, Anxious, on Edge 0 0 0 0  Control/stop worrying 0 0 0 0  Worry too much - different things 0 1 0 0  Trouble relaxing 0 0 0 0  Restless 0 0 0 0  Easily annoyed or irritable 0 0 0 0  Afraid - awful might happen 0 0 0 0  Total GAD 7 Score 0 1 0 0  Anxiety Difficulty Not difficult at all Not difficult at all Not difficult at all Not difficult at all       05/24/2024    9:43 AM 12/06/2023    9:48 AM 08/24/2023    3:20 PM  Depression screen PHQ 2/9  Decreased Interest 0 1 0  Down, Depressed, Hopeless 0 0 0  PHQ - 2 Score 0 1 0  Altered sleeping 0 0 0  Tired, decreased energy 0 1 0  Change in appetite 0 0 0  Feeling bad or failure about yourself  0 0 0  Trouble concentrating 0 0 0  Moving slowly or fidgety/restless 0 0 0  Suicidal thoughts 0 0 0  PHQ-9 Score 0 2 0  Difficult doing work/chores Not difficult at all Not difficult at all Not difficult at all    BP Readings from Last 3 Encounters:  05/24/24 136/78  12/06/23 116/70  12/01/23 136/80    Physical Exam Vitals and nursing note reviewed.  Constitutional:      General: He is not in acute distress.    Appearance: Normal appearance. He is well-developed.  HENT:     Head: Normocephalic and atraumatic.  Neck:     Vascular: No carotid bruit.  Cardiovascular:     Rate and Rhythm: Normal rate and regular rhythm.  Pulmonary:     Effort:  Pulmonary effort is normal. No respiratory distress.     Breath sounds: Decreased breath sounds present. No wheezing or rhonchi.  Musculoskeletal:     Cervical back: Normal range of motion.  Lymphadenopathy:     Cervical: No cervical adenopathy.  Skin:    General: Skin is warm and dry.     Findings: No rash.  Neurological:     Mental Status: He is alert and oriented to person, place, and time.  Psychiatric:        Attention and  Perception: Attention normal.        Mood and Affect: Mood normal.        Behavior: Behavior normal.     Wt Readings from Last 3 Encounters:  05/24/24 206 lb (93.4 kg)  12/06/23 205 lb (93 kg)  12/01/23 205 lb (93 kg)    BP 136/78   Pulse 81   Ht 6' (1.829 m)   Wt 206 lb (93.4 kg)   SpO2 95%   BMI 27.94 kg/m   Assessment and Plan:  Problem List Items Addressed This Visit       Unprioritized   Hyperlipidemia LDL goal <100 (Chronic)   LDL is  Lab Results  Component Value Date   LDLCALC 96 12/06/2023    Current medication regimen is Crestor . Goal LDL is < 70.       Relevant Medications   rosuvastatin  (CRESTOR ) 40 MG tablet   Essential hypertension - Primary (Chronic)   Well controlled blood pressure today. Current regimen is lisinopril  and hydrochlorothiazide . No medication side effects noted.        Relevant Medications   rosuvastatin  (CRESTOR ) 40 MG tablet   Other Visit Diagnoses       Encounter for immunization       Relevant Orders   Pneumococcal conjugate vaccine 20-valent (Completed)       Return in about 6 months (around 11/22/2024) for [facility] CPX  Dr. Lemon.    Leita HILARIO Adie, MD [facility] [place]

## 2024-05-24 NOTE — Assessment & Plan Note (Signed)
 Well controlled blood pressure today. Current regimen is lisinopril  and hydrochlorothiazide . No medication side effects noted.

## 2024-05-24 NOTE — Assessment & Plan Note (Signed)
 LDL is  Lab Results  Component Value Date   LDLCALC 96 12/06/2023    Current medication regimen is Crestor . Goal LDL is < 70.

## 2024-05-27 NOTE — Progress Notes (Unsigned)
 Cardiology Clinic Note   Date: 05/29/2024 ID: Tyler Estes, DOB 02/04/1956, MRN [id number]  Primary Cardiologist:  Alm Clay, MD  Chief Complaint   [name] is a 68 y.o. male who presents to the clinic today for routine follow up.   Patient Profile   Tyler Estes is followed by Dr. Clay for the history outlined below.      Past medical history significant for: Coronary artery calcification. Seen on CT. Ascending aortic dilatation. Echo 02/24/2023: EF 60 to 65%.  No RWMA.  Grade I DD.  Normal RV size/function.  Mild MR/AI.  Mild dilatation of ascending aorta 40 mm. Pulmonary hypertension. Hypertension. Hyperlipidemia. Lipid panel 12/06/2023: LDL 96, HDL 60, TG 154, total 183. Emphysema. 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 Tobacco abuse.  In summary, February 2022 revealed ascending thoracic aorta 3.8 cm, moderate coronary calcification left main and LAD.  CT chest (lung cancer screening) February 2024 showed benign appearance of the lung, aortic atherosclerosis with coronary calcification, enlarged pulmonary trunk indicating possible pulmonary arterial hypertension, emphysema.  Patient also with history of PE on Coumadin .  Was complicated by left lower extremity engorged varicose vein for which he underwent GSV ablation.  Patient was first evaluated by Dr. Clay on 02/10/2023 for pulmonary hypertension at the request of Dr. Justus.  No complaints at the time of his visit.  He reported family history of heart disease in father who had an MI at age 85 and died with mesothelioma in his late 80s.  He reported dyspnea with heavier exertion or excessive heat.  Echo was ordered as detailed above.   Patient was last seen in the office by me on 12/01/2023 for routine follow-up.  He was doing well at that time and no changes were made.      History of Present Illness    Today, patient is doing well. Patient denies shortness of breath, dyspnea on exertion,  lower extremity edema, orthopnea or PND. No chest pain, pressure, or tightness. No palpitations.  He recently went to [place]  with his family. He hiked, snorkeled and went golfing without limitation. He does not check BP at home regularly but typically gets readings in the 130s systolic. He has decreased smoking to less than a pack a day.     ROS: All other systems reviewed and are otherwise negative except as noted in History of Present Illness.  EKGs/Labs Reviewed    EKG Interpretation Date/Time:  Tuesday May 29 2024 10:21:13 EDT Ventricular Rate:  66 PR Interval:  198 QRS Duration:  72 QT Interval:  388 QTC Calculation: 406 R Axis:   26  Text Interpretation: Normal sinus rhythm Normal ECG When compared with ECG of 01-Dec-2023 10:00, T wave inversion no longer evident in Lateral leads Confirmed by Loistine Sober [phone]) on 05/29/2024 10:29:10 AM   12/06/2023: ALT 36; AST 27; BUN 16; Creatinine, Ser 1.14; Potassium 4.2; Sodium 140   12/06/2023: Hemoglobin 17.0; WBC 3.5   12/06/2023: TSH 1.580    Risk Assessment/Calculations      HYPERTENSION CONTROL Vitals:   05/29/24 1016 05/29/24 1149  BP: (!) 148/80 (!) 140/80    The patient's blood pressure is elevated above target today.  In order to address the patient's elevated BP: Blood pressure will be monitored at home to determine if medication changes need to be made.           Physical Exam    VS:  BP (!) 140/80 (BP Location: Left Arm, Patient Position: Sitting, Cuff  Size: Normal)   Pulse 66   Ht 6' (1.829 m)   Wt 203 lb 12.8 oz (92.4 kg)   SpO2 97%   BMI 27.64 kg/m  , BMI Body mass index is 27.64 kg/m.  GEN: Well nourished, well developed, in no acute distress. Neck: No JVD or carotid bruits. Cardiac:  RRR.  No murmur. No rubs or gallops.   Respiratory:  Respirations regular and unlabored. Clear to auscultation without rales, wheezing or rhonchi. GI: Soft, nontender, nondistended. Extremities:  Radials/DP/PT 2+ and equal bilaterally. No clubbing or cyanosis. No edema.  Skin: Warm and dry, no rash. Neuro: Strength intact.  Assessment & Plan   Coronary artery calcification Seen on CT.  Patient denies chest pain, pressure, tightness. Active golfing 3 times a week and doing yard work. Recently went to [place]  and hiked, golfed and snorkeled without limitation.  -No further workup indicated at this time.   Ascending aortic dilatation Echo July 2024 showed mild dilatation of ascending aorta 40 mm.  Patient denies chest pain, back pain, shortness of breath, DOE, lightheadedness, dizziness. Patient undergoes yearly CT chest for cancer screening. -Continue to monitor.   Hypertension BP today 148/80 on intake and 140/80 on my recheck. Home BP checked periodically and typically in the 130s systolic. No report of headache or dizziness.  -Continue Zestoretic .   Hyperlipidemia LDL 96 April 2025, at goal. -Continue rosuvastatin . -Followed by PCP.   History of PE/DVT Patient denies spontaneous bleeding concerns. He has been on a stable dose of Coumadin  for years. His PCP checks inr once a year.  -Continue Coumadin  managed by PCP.   Tobacco Abuse Patient has cut back on smoking and smokes less than a pack a day. Discussed continuing to decrease cigarettes.  -Encouraged complete cessation.  Disposition: Return in 6 months or sooner as needed.          Signed, Barnie HERO. Mylik Pro, DNP, NP-C

## 2024-05-29 ENCOUNTER — Ambulatory Visit: Attending: Student | Admitting: Student

## 2024-05-29 ENCOUNTER — Encounter: Payer: Self-pay | Admitting: Student

## 2024-05-29 VITALS — BP 140/80 | HR 66 | Ht 72.0 in | Wt 203.8 lb

## 2024-05-29 DIAGNOSIS — I10 Essential (primary) hypertension: Secondary | ICD-10-CM | POA: Diagnosis not present

## 2024-05-29 DIAGNOSIS — I2510 Atherosclerotic heart disease of native coronary artery without angina pectoris: Secondary | ICD-10-CM

## 2024-05-29 DIAGNOSIS — E7800 Pure hypercholesterolemia, unspecified: Secondary | ICD-10-CM | POA: Diagnosis not present

## 2024-05-29 DIAGNOSIS — Z720 Tobacco use: Secondary | ICD-10-CM

## 2024-05-29 DIAGNOSIS — I77810 Thoracic aortic ectasia: Secondary | ICD-10-CM

## 2024-05-29 NOTE — Patient Instructions (Signed)
 Medication Instructions:   Your physician recommends that you continue on your current medications as directed. Please refer to the Current Medication list given to you today.    *If you need a refill on your cardiac medications before your next appointment, please call your pharmacy*  Lab Work:  None ordered at this time   If you have labs (blood work) drawn today and your tests are completely normal, you will receive your results only by:  MyChart Message (if you have MyChart) OR  A paper copy in the mail If you have any lab test that is abnormal or we need to change your treatment, we will call you to review the results.  Testing/Procedures:  None ordered at this time   Referrals:  None ordered at this time   Follow-Up:  At [facility], you and your health needs are our priority.  As part of our continuing mission to provide you with exceptional heart care, our providers are all part of one team.  This team includes your primary Cardiologist (physician) and Advanced Practice Providers or APPs (Physician Assistants and Nurse Practitioners) who all work together to provide you with the care you need, when you need it.  Your next appointment:   6 month(s)  Provider:    Alm Clay, MD or Barnie Hila, NP    We recommend signing up for the patient portal called MyChart.  Sign up information is provided on this After Visit Summary.  MyChart is used to connect with patients for Virtual Visits (Telemedicine).  Patients are able to view lab/test results, encounter notes, upcoming appointments, etc.  Non-urgent messages can be sent to your provider as well.   To learn more about what you can do with MyChart, go to [email or web address].

## 2024-08-30 ENCOUNTER — Ambulatory Visit (INDEPENDENT_AMBULATORY_CARE_PROVIDER_SITE_OTHER)

## 2024-08-30 VITALS — Ht 72.0 in | Wt 203.0 lb

## 2024-08-30 DIAGNOSIS — Z0000 Encounter for general adult medical examination without abnormal findings: Secondary | ICD-10-CM | POA: Diagnosis not present

## 2024-08-30 NOTE — Patient Instructions (Signed)
 [name],  Thank you for taking the time for your Medicare Wellness Visit. I appreciate your continued commitment to your health goals. Please review the care plan we discussed, and feel free to reach out if I can assist you further.  Please note that Annual Wellness Visits do not include a physical exam. Some assessments may be limited, especially if the visit was conducted virtually. If needed, we may recommend an in-person follow-up with your provider.  Ongoing Care Seeing your primary care provider every 3 to 6 months helps us  monitor your health and provide consistent, personalized care.   Referrals If a referral was made during today's visit and you haven't received any updates within two weeks, please contact the referred provider directly to check on the status.  Recommended Screenings:  Health Maintenance  Topic Date Due   Zoster (Shingles) Vaccine (1 of 2) Never done   COVID-19 Vaccine (3 - Moderna risk series) 01/24/2020   DTaP/Tdap/Td vaccine (2 - Td or Tdap) 01/29/2024   Medicare Annual Wellness Visit  08/23/2024   Flu Shot  11/13/2024*   Screening for Lung Cancer  10/26/2024   Colon Cancer Screening  01/28/2028   Pneumococcal Vaccine for age over 76  Completed   Hepatitis C Screening  Completed   Meningitis B Vaccine  Aged Out  *Topic was postponed. The date shown is not the original due date.       08/30/2024    3:56 PM  Advanced Directives  Does Patient Have a Medical Advance Directive? No  Would patient like information on creating a medical advance directive? Yes (MAU/Ambulatory/Procedural Areas - Information given)    Vision: Annual vision screenings are recommended for early detection of glaucoma, cataracts, and diabetic retinopathy. These exams can also reveal signs of chronic conditions such as diabetes and high blood pressure.  Dental: Annual dental screenings help detect early signs of oral cancer, gum disease, and other conditions linked to overall  health, including heart disease and diabetes.  Please see the attached documents for additional preventive care recommendations.

## 2024-08-30 NOTE — Progress Notes (Signed)
 "  Please attest and cosign this visit due to patients primary care provider not being in the office at the time the visit was completed.   Chief Complaint  Patient presents with   Medicare Wellness     Subjective:   Tyler Estes is a 69 y.o. male who presents for a Medicare Annual Wellness Visit.  Visit info / Clinical Intake: Medicare Wellness Visit Type:: Subsequent Annual Wellness Visit Persons participating in visit and providing information:: patient Medicare Wellness Visit Mode:: Telephone If telephone:: video declined Since this visit was completed virtually, some vitals may be partially provided or unavailable. Missing vitals are due to the limitations of the virtual format.: Unable to obtain vitals - no equipment If Telephone or Video please confirm:: I connected with patient using audio/video enable telemedicine. I verified patient identity with two identifiers, discussed telehealth limitations, and patient agreed to proceed. Patient Location:: home Provider Location:: clinic Interpreter Needed?: No Pre-visit prep was completed: yes AWV questionnaire completed by patient prior to visit?: no Living arrangements:: lives with spouse/significant other Patient's Overall Health Status Rating: very good Typical amount of pain: none Does pain affect daily life?: no Are you currently prescribed opioids?: no  Dietary Habits and Nutritional Risks How many meals a day?: 3 Eats fruit and vegetables daily?: yes Most meals are obtained by: preparing own meals In the last 2 weeks, have you had any of the following?: none Diabetic:: no  Functional Status Activities of Daily Living (to include ambulation/medication): Independent Ambulation: Independent Medication Administration: Independent Home Management (perform basic housework or laundry): Independent Manage your own finances?: yes Primary transportation is: driving Concerns about vision?: no *vision screening is required for  WTM* Concerns about hearing?: (!) yes Uses hearing aids?: no Hear whispered voice?: (!) no *in-person visit only*  Fall Screening Falls in the past year?: 0 Number of falls in past year: 0 Was there an injury with Fall?: 0 Fall Risk Category Calculator: 0 Patient Fall Risk Level: Low Fall Risk  Fall Risk Patient at Risk for Falls Due to: No Fall Risks Fall risk Follow up: Falls evaluation completed; Education provided; Falls prevention discussed  Home and Transportation Safety: All rugs have non-skid backing?: N/A, no rugs All stairs or steps have railings?: N/A, no stairs Grab bars in the bathtub or shower?: (!) no Have non-skid surface in bathtub or shower?: yes Good home lighting?: yes Regular seat belt use?: yes Hospital stays in the last year:: no  Cognitive Assessment Difficulty concentrating, remembering, or making decisions? : no Will 6CIT or Mini Cog be Completed: yes What year is it?: 0 points What month is it?: 0 points Give patient an address phrase to remember (5 components): [address] [place]  About what time is it?: 0 points Count backwards from 20 to 1: 0 points Say the months of the year in reverse: 0 points Repeat the address phrase from earlier: 0 points 6 CIT Score: 0 points  Advance Directives (For Healthcare) Does Patient Have a Medical Advance Directive?: No Would patient like information on creating a medical advance directive?: Yes (MAU/Ambulatory/Procedural Areas - Information given)  Reviewed/Updated  Reviewed/Updated: Reviewed All (Medical, Surgical, Family, Medications, Allergies, Care Teams, Patient Goals)    Allergies (verified) Patient has no known allergies.   Current Medications (verified) Outpatient Encounter Medications as of 08/30/2024  Medication Sig   lisinopril -hydrochlorothiazide  (ZESTORETIC ) 20-25 MG tablet Take 2 tablets by mouth daily.   rosuvastatin  (CRESTOR ) 40 MG tablet Take 1 tablet (40 mg total) by  mouth  daily.   warfarin (COUMADIN ) 7.5 MG tablet TAKE 1/2 TABLET BY MOUTH ON MONDAYS. THEN TAKE 1 TABLET DAILY THE REST OF THE WEEK.   No facility-administered encounter medications on file as of 08/30/2024.    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 per patient's choice   Hyperlipidemia LDL goal <100    Hypertension    Past Surgical History:  Procedure Laterality Date   COLONOSCOPY     COLONOSCOPY WITH PROPOFOL  N/A 01/27/2018   Procedure: COLONOSCOPY WITH PROPOFOL ;  Surgeon: Gaylyn Gladis PENNER, MD;  Location: [facility] ENDOSCOPY;  Service: Endoscopy;  Laterality: N/A;   TUBAL LIGATION     VASCULAR SURGERY  2013   Leg   VASECTOMY     Family History  Problem Relation Age of Onset   Hyperlipidemia Mother    Varicose Veins Mother    Heart disease Father    Mesothelioma Father    Cancer Father    Hearing loss Father    Dementia Sister    Anxiety disorder Sister    Varicose Veins Son    Social History   Occupational History   Occupation: Merchandiser, Retail at [employer]    Comment: [employer]   Occupation: Retired  Tobacco Use   Smoking status: Every Day    Current packs/day: 0.75    Average packs/day: 1 pack/day for 52.0 years (50.8 ttl pk-yrs)    Types: Cigarettes    Start date: 1974   Smokeless tobacco: Never   Tobacco comments:    currently .75ppd  Vaping Use   Vaping status: Never Used  Substance and Sexual Activity   Alcohol use: Yes    Alcohol/week: 28.0 standard drinks of alcohol    Types: 28 Cans of beer per week    Comment: 4 beers a day   Drug use: No    Comment: Quit in 1997   Sexual activity: Yes   Tobacco Counseling Ready to quit: Not Answered Counseling given: Not Answered Tobacco comments:  currently .75ppd  SDOH Screenings   Food Insecurity: No Food Insecurity (08/30/2024)  Housing: Unknown (08/30/2024)  Transportation Needs: No Transportation Needs (08/30/2024)  Utilities: Not At Risk (08/30/2024)  Alcohol Screen: Low Risk (08/24/2023)  Depression (PHQ2-9): Low Risk (08/30/2024)  Financial Resource Strain: Low Risk (08/24/2023)  Physical Activity: Sufficiently Active (08/30/2024)  Social Connections: Moderately Isolated (08/30/2024)  Stress: No Stress Concern Present (08/30/2024)  Tobacco Use: High Risk (08/30/2024)  Health Literacy: Adequate Health Literacy (08/30/2024)   See flowsheets for full screening details  Depression Screen PHQ 2 & 9 Depression Scale- Over the past 2 weeks, how often have you been bothered by any of the following problems? Little interest or pleasure in doing things: 0 Feeling down, depressed, or hopeless (PHQ Adolescent also includes...irritable): 0 PHQ-2 Total Score: 0 Trouble falling or staying asleep, or sleeping too much: 0 Feeling tired or having little energy: 0 Poor appetite or overeating (PHQ Adolescent also includes...weight loss): 0 Feeling bad about yourself - or that you are a failure or have let yourself or your family down: 0 Trouble concentrating on things, such as reading the newspaper or watching television (PHQ Adolescent also includes...like school work): 0 Moving or speaking so slowly  that other people could have noticed. Or the opposite - being so fidgety or restless that you have been moving around a lot more than usual: 0 Thoughts that you would be better off dead, or of hurting yourself in some way: 0 PHQ-9 Total Score: 0 If you checked off any problems, how difficult have these problems made it for you to do your work, take care of things at home, or get along with other people?: Not difficult at all     Goals Addressed             This Visit's Progress    DIET - EAT MORE FRUITS AND VEGETABLES   On track    Patient  Stated   On track    Pt states he would like to remain healthy and active             Objective:    Today's Vitals   08/30/24 1555  Weight: 203 lb (92.1 kg)  Height: 6' (1.829 m)   Body mass index is 27.53 kg/m.  Hearing/Vision screen No results found. Immunizations and Health Maintenance Health Maintenance  Topic Date Due   Zoster Vaccines- Shingrix (1 of 2) Never done   COVID-19 Vaccine (3 - Moderna risk series) 01/24/2020   DTaP/Tdap/Td (2 - Td or Tdap) 01/29/2024   Medicare Annual Wellness (AWV)  08/23/2024   Influenza Vaccine  11/13/2024 (Originally 03/16/2024)   Lung Cancer Screening  10/26/2024   Colonoscopy  01/28/2028   Pneumococcal Vaccine: 50+ Years  Completed   Hepatitis C Screening  Completed   Meningococcal B Vaccine  Aged Out        Assessment/Plan:  This is a routine wellness examination for Tyler Estes.  Patient Care Team: Anner Alm ORN, MD as PCP - Cardiology (Cardiology) Ora Deward GRADE, MD (Inactive) (Gastroenterology)  I have personally reviewed and noted the following in the patients chart:   Medical and social history Use of alcohol, tobacco or illicit drugs  Current medications and supplements including opioid prescriptions. Functional ability and status Nutritional status Physical activity Advanced directives List of other physicians Hospitalizations, surgeries, and ER visits in previous 12 months Vitals Screenings to include cognitive, depression, and falls Referrals and appointments  No orders of the defined types were placed in this encounter.  In addition, I have reviewed and discussed with patient certain preventive protocols, quality metrics, and best practice recommendations. A written personalized care plan for preventive services as well as general preventive health recommendations were provided to patient.   Erminio LITTIE Saris, LPN   [id number]/84/[id number]   No follow-ups on file.  After Visit Summary: (MyChart) Due to this being a  telephonic visit, the after visit summary with patients personalized plan was offered to patient via MyChart   Nurse Notes: No voiced or noted concerns at this time Vaccines not given: Covid, Influenza, Shingles declined today  "

## 2024-11-22 ENCOUNTER — Encounter: Admitting: Student

## 2024-12-04 ENCOUNTER — Encounter: Admitting: Student

## 2024-12-13 ENCOUNTER — Encounter: Admitting: Student
